# Patient Record
Sex: Male | Born: 1975
Health system: Southern US, Community
[De-identification: ages and names within clinical notes are randomized; demographics above are authoritative.]

## PROBLEM LIST (undated history)

## (undated) DIAGNOSIS — E119 Type 2 diabetes mellitus without complications: Secondary | ICD-10-CM

## (undated) DIAGNOSIS — K219 Gastro-esophageal reflux disease without esophagitis: Secondary | ICD-10-CM

## (undated) HISTORY — DX: Gastro-esophageal reflux disease without esophagitis: K21.9

## (undated) HISTORY — DX: Type 2 diabetes mellitus without complications: E11.9

---

## 2019-01-08 ENCOUNTER — Encounter (HOSPITAL_COMMUNITY): Payer: Self-pay | Admitting: Emergency Medicine

## 2019-01-08 ENCOUNTER — Emergency Department (HOSPITAL_COMMUNITY): Payer: 59

## 2019-01-08 ENCOUNTER — Observation Stay (HOSPITAL_COMMUNITY)
Admission: EM | Admit: 2019-01-08 | Discharge: 2019-01-11 | DRG: 392 | Disposition: A | Discharge status: Home / Self Care | Payer: 59 | Attending: Internal Medicine | Admitting: Internal Medicine

## 2019-01-08 ENCOUNTER — Other Ambulatory Visit: Payer: Self-pay

## 2019-01-08 DIAGNOSIS — F17210 Nicotine dependence, cigarettes, uncomplicated: Secondary | ICD-10-CM | POA: Diagnosis not present

## 2019-01-08 DIAGNOSIS — I10 Essential (primary) hypertension: Secondary | ICD-10-CM | POA: Diagnosis present

## 2019-01-08 DIAGNOSIS — R112 Nausea with vomiting, unspecified: Secondary | ICD-10-CM | POA: Diagnosis present

## 2019-01-08 DIAGNOSIS — F1010 Alcohol abuse, uncomplicated: Secondary | ICD-10-CM | POA: Diagnosis present

## 2019-01-08 DIAGNOSIS — R109 Unspecified abdominal pain: Secondary | ICD-10-CM

## 2019-01-08 DIAGNOSIS — K2900 Acute gastritis without bleeding: Secondary | ICD-10-CM | POA: Diagnosis not present

## 2019-01-08 DIAGNOSIS — K760 Fatty (change of) liver, not elsewhere classified: Secondary | ICD-10-CM | POA: Diagnosis not present

## 2019-01-08 DIAGNOSIS — Z720 Tobacco use: Secondary | ICD-10-CM | POA: Diagnosis present

## 2019-01-08 DIAGNOSIS — E119 Type 2 diabetes mellitus without complications: Secondary | ICD-10-CM | POA: Diagnosis not present

## 2019-01-08 DIAGNOSIS — R1084 Generalized abdominal pain: Secondary | ICD-10-CM

## 2019-01-08 DIAGNOSIS — Z1159 Encounter for screening for other viral diseases: Secondary | ICD-10-CM | POA: Diagnosis not present

## 2019-01-08 LAB — URINALYSIS, ROUTINE W REFLEX MICROSCOPIC
Bacteria, UA: NONE SEEN
Bilirubin Urine: NEGATIVE
Glucose, UA: >=500 mg/dL — AB
Hgb urine dipstick: NEGATIVE
Ketones, ur: 5 mg/dL — AB
Leukocytes,Ua: NEGATIVE
Nitrite: NEGATIVE
Protein, ur: NEGATIVE mg/dL
Specific Gravity, Urine: 1.013 (ref 1.005–1.030)
pH: 7 (ref 5.0–8.0)

## 2019-01-08 LAB — CBC WITH DIFFERENTIAL/PLATELET
Abs Immature Granulocytes: 0.01 10*3/uL (ref 0.00–0.07)
Basophils Absolute: 0 10*3/uL (ref 0.0–0.1)
Basophils Relative: 0 %
Eosinophils Absolute: 0 10*3/uL (ref 0.0–0.5)
Eosinophils Relative: 1 %
HCT: 43.7 % (ref 39.0–52.0)
Hemoglobin: 15.5 g/dL (ref 13.0–17.0)
Immature Granulocytes: 0 %
Lymphocytes Relative: 33 %
Lymphs Abs: 1.7 10*3/uL (ref 0.7–4.0)
MCH: 31.6 pg (ref 26.0–34.0)
MCHC: 35.5 g/dL (ref 30.0–36.0)
MCV: 89 fL (ref 80.0–100.0)
Monocytes Absolute: 0.3 10*3/uL (ref 0.1–1.0)
Monocytes Relative: 6 %
Neutro Abs: 3.2 10*3/uL (ref 1.7–7.7)
Neutrophils Relative %: 60 %
Platelets: 176 10*3/uL (ref 150–400)
RBC: 4.91 MIL/uL (ref 4.22–5.81)
RDW: 11.9 % (ref 11.5–15.5)
WBC: 5.2 10*3/uL (ref 4.0–10.5)
nRBC: 0 % (ref 0.0–0.2)

## 2019-01-08 LAB — COMPREHENSIVE METABOLIC PANEL
ALT: 67 U/L — ABNORMAL HIGH (ref 0–44)
AST: 54 U/L — ABNORMAL HIGH (ref 15–41)
Albumin: 3.8 g/dL (ref 3.5–5.0)
Alkaline Phosphatase: 65 U/L (ref 38–126)
Anion gap: 12 (ref 5–15)
BUN: 10 mg/dL (ref 6–20)
CO2: 22 mmol/L (ref 22–32)
Calcium: 8.8 mg/dL — ABNORMAL LOW (ref 8.9–10.3)
Chloride: 101 mmol/L (ref 98–111)
Creatinine, Ser: 0.8 mg/dL (ref 0.61–1.24)
GFR calc Af Amer: >60 mL/min (ref >60)
GFR calc non Af Amer: >60 mL/min (ref >60)
Glucose, Bld: 208 mg/dL — ABNORMAL HIGH (ref 70–99)
Potassium: 3.6 mmol/L (ref 3.5–5.1)
Sodium: 135 mmol/L (ref 135–145)
Total Bilirubin: 0.8 mg/dL (ref 0.3–1.2)
Total Protein: 6.6 g/dL (ref 6.5–8.1)

## 2019-01-08 LAB — LACTIC ACID, PLASMA
Lactic Acid, Venous: 1.8 mmol/L (ref 0.5–1.9)
Lactic Acid, Venous: 2 mmol/L (ref 0.5–1.9)
Lactic Acid, Venous: 1.4 mmol/L (ref 0.5–1.9)

## 2019-01-08 LAB — SARS CORONAVIRUS 2 BY RT PCR (HOSPITAL ORDER, PERFORMED IN ~~LOC~~ HOSPITAL LAB): SARS Coronavirus 2: NEGATIVE

## 2019-01-08 LAB — LIPASE, BLOOD: Lipase: 42 U/L (ref 11–51)

## 2019-01-08 MED ORDER — ONDANSETRON HCL 4 MG/2ML IJ SOLN
4.0000 mg | Freq: Once | INTRAMUSCULAR | Status: AC
  Administered 2019-01-08: 18:00:00 4 mg via INTRAVENOUS
  Filled 2019-01-08: qty 2

## 2019-01-08 MED ORDER — MORPHINE SULFATE (PF) 4 MG/ML IV SOLN
8.0000 mg | Freq: Once | INTRAVENOUS | Status: AC
  Administered 2019-01-08: 16:00:00 8 mg via INTRAVENOUS
  Filled 2019-01-08: qty 2

## 2019-01-08 MED ORDER — HYDROMORPHONE HCL 1 MG/ML IJ SOLN
1.0000 mg | Freq: Once | INTRAMUSCULAR | Status: AC
  Administered 2019-01-08: 21:00:00 1 mg via INTRAVENOUS
  Filled 2019-01-08: qty 1

## 2019-01-08 MED ORDER — LIDOCAINE VISCOUS HCL 2 % MT SOLN
15.0000 mL | Freq: Once | OROMUCOSAL | Status: AC
  Administered 2019-01-08: 18:00:00 15 mL via ORAL
  Filled 2019-01-08: qty 15

## 2019-01-08 MED ORDER — LACTATED RINGERS IV BOLUS
1000.0000 mL | Freq: Once | INTRAVENOUS | Status: AC
  Administered 2019-01-08: 16:00:00 1000 mL via INTRAVENOUS

## 2019-01-08 MED ORDER — HYDROMORPHONE HCL 1 MG/ML IJ SOLN
1.0000 mg | Freq: Once | INTRAMUSCULAR | Status: AC
  Administered 2019-01-09: 01:00:00 1 mg via INTRAVENOUS
  Filled 2019-01-08: qty 1

## 2019-01-08 MED ORDER — ONDANSETRON HCL 4 MG/2ML IJ SOLN
4.0000 mg | Freq: Once | INTRAMUSCULAR | Status: AC
  Administered 2019-01-08: 4 mg via INTRAVENOUS
  Filled 2019-01-08: qty 2

## 2019-01-08 MED ORDER — ALUM & MAG HYDROXIDE-SIMETH 200-200-20 MG/5ML PO SUSP
30.0000 mL | Freq: Once | ORAL | Status: AC
  Administered 2019-01-08: 18:00:00 30 mL via ORAL
  Filled 2019-01-08: qty 30

## 2019-01-08 MED ORDER — IOHEXOL 300 MG/ML  SOLN
100.0000 mL | Freq: Once | INTRAMUSCULAR | Status: AC | PRN
  Administered 2019-01-08: 100 mL via INTRAVENOUS

## 2019-01-08 MED ORDER — PANTOPRAZOLE SODIUM 40 MG IV SOLR
40.0000 mg | Freq: Once | INTRAVENOUS | Status: AC
  Administered 2019-01-08: 21:00:00 40 mg via INTRAVENOUS
  Filled 2019-01-08: qty 40

## 2019-01-08 NOTE — ED Provider Notes (Signed)
[facility]   CSN: [id number] Arrival date & time: 01/08/19  1425    History   Chief Complaint Chief Complaint  Patient presents with   Abdominal Pain    HPI Leroy Jimenez is a 43 y.o. male.  The history is provided by the patient. The history is limited by a language barrier. A language interpreter was used.   43 year old male with no significant past medical history presents with abdominal pain.  Patient states that he was diagnosed with stomach ulcers a few weeks ago.  He states he has had worsening abdominal pain over the past 15 days that is significantly worsened over the past 3 to 4 days.  He has had associated nonbloody emesis.  No melena or hematochezia.  Pain is located in the epigastrium and is nonradiating and described as sharp.  No prior abdominal surgeries.  History reviewed. No pertinent past medical history.  Patient Active Problem List   Diagnosis Date Noted   HTN (hypertension) 01/08/2019   Diabetes (HCC) 01/08/2019   Nausea and/or vomiting 01/08/2019    History reviewed. No pertinent surgical history.      Home Medications    Prior to Admission medications   Not on File    Family History History reviewed. No pertinent family history.  Social History Social History   Tobacco Use   Smoking status: Never Smoker   Smokeless tobacco: Never Used  Substance Use Topics   Alcohol use: Yes   Drug use: Not on file     Allergies   Patient has no known allergies.   Review of Systems Review of Systems  Constitutional: Negative for chills and fever.  HENT: Negative for ear pain and sore throat.   Eyes: Negative for pain and visual disturbance.  Respiratory: Negative for cough and shortness of breath.   Cardiovascular: Negative for chest pain and palpitations.  Gastrointestinal: Positive for abdominal pain, nausea and vomiting. Negative for blood in stool.  Genitourinary:  Negative for dysuria and hematuria.  Musculoskeletal: Negative for arthralgias and back pain.  Skin: Negative for color change and rash.  Neurological: Negative for seizures and syncope.  All other systems reviewed and are negative.    Physical Exam Updated Vital Signs BP 131/78    Pulse 65    Resp (!) 9    SpO2 98%   Physical Exam Vitals signs and nursing note reviewed.  Constitutional:      General: He is in acute distress.     Appearance: He is well-developed.  HENT:     Head: Normocephalic and atraumatic.  Eyes:     Conjunctiva/sclera: Conjunctivae normal.  Neck:     Musculoskeletal: Neck supple.  Cardiovascular:     Rate and Rhythm: Normal rate and regular rhythm.     Heart sounds: No murmur.  Pulmonary:     Effort: Pulmonary effort is normal. No respiratory distress.     Breath sounds: Normal breath sounds.  Abdominal:     Palpations: Abdomen is soft. There is no mass.     Tenderness: There is generalized abdominal tenderness and tenderness in the epigastric area. There is guarding. There is no right CVA tenderness or left CVA tenderness.  Musculoskeletal: Normal range of motion.  Skin:    General: Skin is warm and dry.  Neurological:     General: No focal deficit present.     Mental Status: He is alert and oriented to person, place, and time.  ED Treatments / Results  Labs (all labs ordered are listed, but only abnormal results are displayed) Labs Reviewed  URINALYSIS, ROUTINE W REFLEX MICROSCOPIC - Abnormal; Notable for the following components:      Result Value   Color, Urine STRAW (*)    Glucose, UA >=500 (*)    Ketones, ur 5 (*)    All other components within normal limits  LACTIC ACID, PLASMA - Abnormal; Notable for the following components:   Lactic Acid, Venous 2.0 (*)    All other components within normal limits  COMPREHENSIVE METABOLIC PANEL - Abnormal; Notable for the following components:   Glucose, Bld 208 (*)    Calcium 8.8 (*)    AST  54 (*)    ALT 67 (*)    All other components within normal limits  SARS CORONAVIRUS 2 (HOSPITAL ORDER, PERFORMED IN [facility])  LACTIC ACID, PLASMA  LACTIC ACID, PLASMA  CBC WITH DIFFERENTIAL/PLATELET  LIPASE, BLOOD  ETHANOL  RAPID URINE DRUG SCREEN, HOSP PERFORMED    EKG EKG Interpretation  Date/Time:  Tuesday Jan 08 2019 15:45:26 EDT Ventricular Rate:  82 PR Interval:    QRS Duration: 95 QT Interval:  396 QTC Calculation: 463 R Axis:   102 Text Interpretation:  Sinus rhythm Right axis deviation ECG OTHERWISE WITHIN NORMAL LIMITS No old tracing to compare Confirmed by Eber Hong ([id number]) on 01/08/2019 3:47:23 PM   Radiology Ct Abdomen Pelvis W Contrast  Result Date: 01/08/2019 CLINICAL DATA:  Generalized abdominal pain EXAM: CT ABDOMEN AND PELVIS WITH CONTRAST TECHNIQUE: Multidetector CT imaging of the abdomen and pelvis was performed using the standard protocol following bolus administration of intravenous contrast. CONTRAST:  OMNIPAQUE IOHEXOL 300 MG/ML  SOLN COMPARISON:  Chest x-ray 01/08/2019 FINDINGS: Lower chest: Lung bases demonstrate no acute consolidation or effusion. The heart size is normal. Small hiatal hernia. Hepatobiliary: Hepatic steatosis. No calcified gallstone or biliary dilatation Pancreas: Unremarkable. No pancreatic ductal dilatation or surrounding inflammatory changes. Spleen: Normal in size without focal abnormality. Adrenals/Urinary Tract: Adrenal glands are unremarkable. Kidneys are normal, without renal calculi, focal lesion, or hydronephrosis. Bladder is unremarkable. Stomach/Bowel: Stomach is within normal limits. Appendix appears normal. No evidence of bowel wall thickening, distention, or inflammatory changes. Vascular/Lymphatic: No significant vascular findings are present. No enlarged abdominal or pelvic lymph nodes. Reproductive: Prostate is unremarkable. Other: Small fat containing inguinal hernias. No free air or free fluid  Musculoskeletal: No acute or significant osseous findings. Degenerative changes at L5-S1. IMPRESSION: No CT evidence for acute intra-abdominal or pelvic abnormality. Electronically Signed   By: Jasmine Pang M.D.   On: 01/08/2019 19:02   Dg Chest Portable 1 View  Result Date: 01/08/2019 CLINICAL DATA:  Abdominal pain, diagnosed with ulcers EXAM: PORTABLE CHEST 1 VIEW COMPARISON:  None. FINDINGS: The heart size and mediastinal contours are within normal limits. Both lungs are clear. The visualized skeletal structures are unremarkable. IMPRESSION: No active disease. Electronically Signed   By: Elige Ko   On: 01/08/2019 16:11    Procedures Procedures (including critical care time)  Medications Ordered in ED Medications  HYDROmorphone (DILAUDID) injection 1 mg (has no administration in time range)  lactated ringers bolus 1,000 mL (0 mLs Intravenous Stopped 01/08/19 2049)  morphine 4 MG/ML injection 8 mg (8 mg Intravenous Given 01/08/19 1532)  ondansetron (ZOFRAN) injection 4 mg (4 mg Intravenous Given 01/08/19 1532)  alum & mag hydroxide-simeth (MAALOX/MYLANTA) 200-200-20 MG/5ML suspension 30 mL (30 mLs Oral Given 01/08/19 1759)  And  lidocaine (XYLOCAINE) 2 % viscous mouth solution 15 mL (15 mLs Oral Given 01/08/19 1759)  ondansetron (ZOFRAN) injection 4 mg (4 mg Intravenous Given 01/08/19 1755)  iohexol (OMNIPAQUE) 300 MG/ML solution 100 mL (100 mLs Intravenous Contrast Given 01/08/19 1843)  pantoprazole (PROTONIX) injection 40 mg (40 mg Intravenous Given 01/08/19 2050)  HYDROmorphone (DILAUDID) injection 1 mg (1 mg Intravenous Given 01/08/19 2050)     Initial Impression / Assessment and Plan / ED Course  I have reviewed the triage vital signs and the nursing notes.  Pertinent labs & imaging results that were available during my care of the patient were reviewed by me and considered in my medical decision making (see chart for details).  43 year old male with no significant past medical history  presents with abdominal pain.  Patient hemodynamically stable.  Afebrile.  On exam, patient appears very uncomfortable.  He has significant tenderness to palpation of the epigastrium associated guarding.  Differential includes gastric perforation, acute cholecystitis, nephrolithiasis, gastritis.  EKG: EKG is normal sinus rhythm.  Right axis deviation.  No ischemic changes.  IV established and 8 mg of IV morphine given.  Bolus of LR given.  On reevaluation, patient is still very uncomfortable.  1 g of Dilaudid given.  Sometime later he was still in pain and given a GI cocktail with minimal improvement in symptoms.   Upright chest x-ray shows no active disease.   Lipase 42.  Glucose 208, calcium 8.8, AST 54, and ALT 67.  Creatinine 0.8.Lactic acid 1.4 from 2 initially.  CBC unremarkable.  UA with 500 glucose, 5 ketones, no signs of infection.  Patient is still having ongoing abdominal pain, nausea, and vomiting.  Additional 1 mg of Dilaudid given as well as IV Protonix.  Patient admitted to the hospitalist for further management.  Final Clinical Impressions(s) / ED Diagnoses   Final diagnoses:  Generalized abdominal pain  Intractable vomiting with nausea, unspecified vomiting type    ED Discharge Orders    None       Vallery Ridge, MD 01/08/19 2349    Eber Hong, MD 01/14/19 [phone]

## 2019-01-08 NOTE — H&P (Signed)
History and Physical   Browning Southwood WUJ:[id number] DOB: 02/10/1976 DOA: 01/08/2019  Referring MD/NP/PA: Dr. Hyacinth Meeker  PCP: System, Pcp Not In   Outpatient Specialists: None  Patient coming from: Home  Chief Complaint: Abdominal pain with nausea and vomiting  HPI: Leroy Jimenez is a 43 y.o. male with medical history significant of Diabetes, hypertension and recent diagnosis of stomach ulcers apparently at [place] who came in with persistent nausea vomiting and epigastric abdominal pain.  He has been having abdominal pain over the last 15 days but has worsened in the last 3 to 4 days.  He was alcoholic his last alcohol intake was last Saturday.  He also smokes a few cigarettes a day.  Denied any other substance use.  Pain is currently 8 out of 10 in the epigastric region going to his back.  He is also been having significant nausea at the moment although vomiting seems to help slow down.  Patient is not acidotic.  He has no anion gap.  He has been taking his medications as prescribed.  He was treated in the ER but symptoms did not resolve.  Patient is therefore being admitted to the hospital for symptom management..  ED Course:  His temperature is 98 to blood pressure 200/111 pulse 85 respiratory rate of 16 oxygen sats 100% on room air.  Complete metabolic panels appears to be normal except for glucose of 208, AST 54 ALT 67.  Lactic acid 1.4.  Alcohol level is less than 10.  CT abdomen pelvis appears to be within normal.  Patient is being admitted with diagnosis of abdominal pain.  Review of Systems: As per HPI otherwise 10 point review of systems negative.    History reviewed. No pertinent past medical history.  History reviewed. No pertinent surgical history.   reports that he has never smoked. He has never used smokeless tobacco. He reports current alcohol use. No history on file for drug.  No Known Allergies  History reviewed. No pertinent family history.   Prior to  Admission medications   Not on File    Physical Exam: Vitals:   01/08/19 2130 01/08/19 2200 01/08/19 2230 01/09/19 0000  BP: 124/74 130/83 131/78 133/80  Pulse: 71 75 65 65  Resp: (!) 9 12 (!) 9 15  SpO2: 98% 99% 98% 99%      Constitutional: Acutely ill looking with moderate distress due to pain Vitals:   01/08/19 2130 01/08/19 2200 01/08/19 2230 01/09/19 0000  BP: 124/74 130/83 131/78 133/80  Pulse: 71 75 65 65  Resp: (!) 9 12 (!) 9 15  SpO2: 98% 99% 98% 99%   Eyes: PERRL, lids and conjunctivae normal ENMT: Mucous membranes are dry. Posterior pharynx clear of any exudate or lesions.Normal dentition.  Neck: normal, supple, no masses, no thyromegaly Respiratory: clear to auscultation bilaterally, no wheezing, no crackles. Normal respiratory effort. No accessory muscle use.  Cardiovascular: Regular rate and rhythm, no murmurs / rubs / gallops. No extremity edema. 2+ pedal pulses. No carotid bruits.  Abdomen: Mild epigastric tenderness, no masses palpated. No hepatosplenomegaly. Bowel sounds positive.  Musculoskeletal: no clubbing / cyanosis. No joint deformity upper and lower extremities. Good ROM, no contractures. Normal muscle tone.  Skin: no rashes, lesions, ulcers. No induration Neurologic: CN 2-12 grossly intact. Sensation intact, DTR normal. Strength 5/5 in all 4.  Psychiatric: Normal judgment and insight. Alert and oriented x 3. Normal mood.     Labs on Admission: I have personally reviewed following labs and imaging  studies  CBC: Recent Labs  Lab 01/08/19 1725  WBC 5.2  NEUTROABS 3.2  HGB 15.5  HCT 43.7  MCV 89.0  PLT 176   Basic Metabolic Panel: Recent Labs  Lab 01/08/19 1821  NA 135  K 3.6  CL 101  CO2 22  GLUCOSE 208*  BUN 10  CREATININE 0.80  CALCIUM 8.8*   GFR: CrCl cannot be calculated (Unknown ideal weight.). Liver Function Tests: Recent Labs  Lab 01/08/19 1821  AST 54*  ALT 67*  ALKPHOS 65  BILITOT 0.8  PROT 6.6  ALBUMIN 3.8    Recent Labs  Lab 01/08/19 1821  LIPASE 42   No results for input(s): AMMONIA in the last 168 hours. Coagulation Profile: No results for input(s): INR, PROTIME in the last 168 hours. Cardiac Enzymes: No results for input(s): CKTOTAL, CKMB, CKMBINDEX, TROPONINI in the last 168 hours. BNP (last 3 results) No results for input(s): PROBNP in the last 8760 hours. HbA1C: No results for input(s): HGBA1C in the last 72 hours. CBG: No results for input(s): GLUCAP in the last 168 hours. Lipid Profile: No results for input(s): CHOL, HDL, LDLCALC, TRIG, CHOLHDL, LDLDIRECT in the last 72 hours. Thyroid Function Tests: No results for input(s): TSH, T4TOTAL, FREET4, T3FREE, THYROIDAB in the last 72 hours. Anemia Panel: No results for input(s): VITAMINB12, FOLATE, FERRITIN, TIBC, IRON, RETICCTPCT in the last 72 hours. Urine analysis:    Component Value Date/Time   COLORURINE STRAW (A) 01/08/2019 1700   APPEARANCEUR CLEAR 01/08/2019 1700   LABSPEC 1.013 01/08/2019 1700   PHURINE 7.0 01/08/2019 1700   GLUCOSEU >=500 (A) 01/08/2019 1700   HGBUR NEGATIVE 01/08/2019 1700   BILIRUBINUR NEGATIVE 01/08/2019 1700   KETONESUR 5 (A) 01/08/2019 1700   PROTEINUR NEGATIVE 01/08/2019 1700   NITRITE NEGATIVE 01/08/2019 1700   LEUKOCYTESUR NEGATIVE 01/08/2019 1700   Sepsis Labs: @LABRCNTIP (procalcitonin:4,lacticidven:4) ) Recent Results (from the past 240 hour(s))  SARS Coronavirus 2 (CEPHEID - Performed in [facility] hospital lab), Hosp Order     Status: None   Collection Time: 01/08/19 10:24 PM  Result Value Ref Range Status   SARS Coronavirus 2 NEGATIVE NEGATIVE Final    Comment: (NOTE) If result is NEGATIVE SARS-CoV-2 target nucleic acids are NOT DETECTED. The SARS-CoV-2 RNA is generally detectable in upper and lower  respiratory specimens during the acute phase of infection. The lowest  concentration of SARS-CoV-2 viral copies this assay can detect is 250  copies / mL. A negative result  does not preclude SARS-CoV-2 infection  and should not be used as the sole basis for treatment or other  patient management decisions.  A negative result may occur with  improper specimen collection / handling, submission of specimen other  than nasopharyngeal swab, presence of viral mutation(s) within the  areas targeted by this assay, and inadequate number of viral copies  (<250 copies / mL). A negative result must be combined with clinical  observations, patient history, and epidemiological information. If result is POSITIVE SARS-CoV-2 target nucleic acids are DETECTED. The SARS-CoV-2 RNA is generally detectable in upper and lower  respiratory specimens dur ing the acute phase of infection.  Positive  results are indicative of active infection with SARS-CoV-2.  Clinical  correlation with patient history and other diagnostic information is  necessary to determine patient infection status.  Positive results do  not rule out bacterial infection or co-infection with other viruses. If result is PRESUMPTIVE POSTIVE SARS-CoV-2 nucleic acids MAY BE PRESENT.   A presumptive positive result  was obtained on the submitted specimen  and confirmed on repeat testing.  While 2019 novel coronavirus  (SARS-CoV-2) nucleic acids may be present in the submitted sample  additional confirmatory testing may be necessary for epidemiological  and / or clinical management purposes  to differentiate between  SARS-CoV-2 and other Sarbecovirus currently known to infect humans.  If clinically indicated additional testing with an alternate test  methodology [phone]([id number]) is advised. The SARS-CoV-2 RNA is generally  detectable in upper and lower respiratory sp ecimens during the acute  phase of infection. The expected result is Negative. Fact Sheet for Patients:  BoilerBrush.com.cyhttps://www.fda.gov/media/136312/download Fact Sheet for Healthcare Providers: https://pope.com/https://www.fda.gov/media/136313/download This test is not yet approved or  cleared by the [place] FDA and has been authorized for detection and/or diagnosis of SARS-CoV-2 by FDA under an Emergency Use Authorization (EUA).  This EUA will remain in effect (meaning this test can be used) for the duration of the COVID-19 declaration under Section 564(b)(1) of the Act, 21 U.S.C. section 360bbb-3(b)(1), unless the authorization is terminated or revoked sooner. Performed at [facility], [address] [place], [place] [address]      Radiological Exams on Admission: Ct Abdomen Pelvis W Contrast  Result Date: 01/08/2019 CLINICAL DATA:  Generalized abdominal pain EXAM: CT ABDOMEN AND PELVIS WITH CONTRAST TECHNIQUE: Multidetector CT imaging of the abdomen and pelvis was performed using the standard protocol following bolus administration of intravenous contrast. CONTRAST:  100mL OMNIPAQUE IOHEXOL 300 MG/ML  SOLN COMPARISON:  Chest x-ray 01/08/2019 FINDINGS: Lower chest: Lung bases demonstrate no acute consolidation or effusion. The heart size is normal. Small hiatal hernia. Hepatobiliary: Hepatic steatosis. No calcified gallstone or biliary dilatation Pancreas: Unremarkable. No pancreatic ductal dilatation or surrounding inflammatory changes. Spleen: Normal in size without focal abnormality. Adrenals/Urinary Tract: Adrenal glands are unremarkable. Kidneys are normal, without renal calculi, focal lesion, or hydronephrosis. Bladder is unremarkable. Stomach/Bowel: Stomach is within normal limits. Appendix appears normal. No evidence of bowel wall thickening, distention, or inflammatory changes. Vascular/Lymphatic: No significant vascular findings are present. No enlarged abdominal or pelvic lymph nodes. Reproductive: Prostate is unremarkable. Other: Small fat containing inguinal hernias. No free air or free fluid Musculoskeletal: No acute or significant osseous findings. Degenerative changes at L5-S1. IMPRESSION: No CT evidence for acute intra-abdominal or pelvic abnormality.  Electronically Signed   By: Jasmine PangKim  Fujinaga M.D.   On: 01/08/2019 19:02   Dg Chest Portable 1 View  Result Date: 01/08/2019 CLINICAL DATA:  Abdominal pain, diagnosed with ulcers EXAM: PORTABLE CHEST 1 VIEW COMPARISON:  None. FINDINGS: The heart size and mediastinal contours are within normal limits. Both lungs are clear. The visualized skeletal structures are unremarkable. IMPRESSION: No active disease. Electronically Signed   By: Elige KoHetal  Patel   On: 01/08/2019 16:11     Assessment/Plan Principal Problem:   Nausea and/or vomiting Active Problems:   HTN (hypertension)   Diabetes (HCC)   Tobacco abuse   Alcohol abuse     #1 intractable nausea vomiting abdominal pain: Patient may be having acute pancreatitis with his alcohol history.  It could also be related to his diabetes.  Gastroparesis is also likely.  I will check lipase.  His CT abdomen pelvis appears to be normal.  No evidence of gallstones or cholecystitis.  Symptomatic treatment therefore.  IV fluids and pain management.  Control nausea.  Patient reported recent diagnosis of peptic ulcer.  Initiate IV Protonix.  #2 diabetes: Initiate sliding scale insulin with home regimen.  #3 hypertension: Blood pressure control  will be initiated with home regimen.  Adjust as necessary.  #4 tobacco abuse: Cessation counseling with nicotine patch.  #5 history of alcohol abuse: Patient reported quitting alcohol now.  He has not had any alcohol in about 10 days.  Continue to encourage patient to stay quit.   DVT prophylaxis: Lovenox Code Status: Full code Family Communication: Discussed with the patient Disposition Plan: Home Consults called: None Admission status: Observation  Severity of Illness: The appropriate patient status for this patient is OBSERVATION. Observation status is judged to be reasonable and necessary in order to provide the required intensity of service to ensure the patient's safety. The patient's presenting symptoms,  physical exam findings, and initial radiographic and laboratory data in the context of their medical condition is felt to place them at decreased risk for further clinical deterioration. Furthermore, it is anticipated that the patient will be medically stable for discharge from the hospital within 2 midnights of admission. The following factors support the patient status of observation.   " The patient's presenting symptoms include abdominal pain nausea vomiting. " The physical exam findings include epigastric tenderness. " The initial radiographic and laboratory data are normal findings.     Lonia Blood MD [facility] Pager [phone]  If 7PM-7AM, please contact night-coverage [email or web address] Password [email or web address]  01/09/2019, 12:22 AM

## 2019-01-08 NOTE — ED Notes (Signed)
Pt continues to have severe pain, guarding and writhing back and forth in bed.  Orders for GI cocktail.

## 2019-01-08 NOTE — ED Notes (Signed)
Pt swabbed for COVID

## 2019-01-08 NOTE — ED Triage Notes (Signed)
Pt diagnosed with stomach ulcers in [place] a few weeks ago, given meds to help with pain. Pt having severe pain with no relief. Has been seen multiple times. Denies fever cough SOB. AO x 4 124/82, HR NSR 90. 16 resp. 97.1 temp, CBG 210.

## 2019-01-09 ENCOUNTER — Other Ambulatory Visit (HOSPITAL_COMMUNITY): Payer: 59

## 2019-01-09 ENCOUNTER — Inpatient Hospital Stay (HOSPITAL_COMMUNITY): Payer: 59

## 2019-01-09 DIAGNOSIS — F1010 Alcohol abuse, uncomplicated: Secondary | ICD-10-CM | POA: Diagnosis present

## 2019-01-09 DIAGNOSIS — I10 Essential (primary) hypertension: Secondary | ICD-10-CM | POA: Diagnosis not present

## 2019-01-09 DIAGNOSIS — R112 Nausea with vomiting, unspecified: Secondary | ICD-10-CM | POA: Diagnosis not present

## 2019-01-09 DIAGNOSIS — Z720 Tobacco use: Secondary | ICD-10-CM | POA: Diagnosis present

## 2019-01-09 DIAGNOSIS — R109 Unspecified abdominal pain: Secondary | ICD-10-CM

## 2019-01-09 LAB — RAPID URINE DRUG SCREEN, HOSP PERFORMED
Amphetamines: NOT DETECTED
Barbiturates: NOT DETECTED
Benzodiazepines: NOT DETECTED
Cocaine: NOT DETECTED
Opiates: POSITIVE — AB
Tetrahydrocannabinol: NOT DETECTED

## 2019-01-09 LAB — COMPREHENSIVE METABOLIC PANEL
ALT: 58 U/L — ABNORMAL HIGH (ref 0–44)
AST: 45 U/L — ABNORMAL HIGH (ref 15–41)
Albumin: 3.2 g/dL — ABNORMAL LOW (ref 3.5–5.0)
Alkaline Phosphatase: 49 U/L (ref 38–126)
Anion gap: 11 (ref 5–15)
BUN: 7 mg/dL (ref 6–20)
CO2: 24 mmol/L (ref 22–32)
Calcium: 8.2 mg/dL — ABNORMAL LOW (ref 8.9–10.3)
Chloride: 103 mmol/L (ref 98–111)
Creatinine, Ser: 0.75 mg/dL (ref 0.61–1.24)
GFR calc Af Amer: >60 mL/min (ref >60)
GFR calc non Af Amer: >60 mL/min (ref >60)
Glucose, Bld: 119 mg/dL — ABNORMAL HIGH (ref 70–99)
Potassium: 3.5 mmol/L (ref 3.5–5.1)
Sodium: 138 mmol/L (ref 135–145)
Total Bilirubin: 1.1 mg/dL (ref 0.3–1.2)
Total Protein: 5.8 g/dL — ABNORMAL LOW (ref 6.5–8.1)

## 2019-01-09 LAB — CBC
HCT: 40.3 % (ref 39.0–52.0)
Hemoglobin: 13.9 g/dL (ref 13.0–17.0)
MCH: 31.1 pg (ref 26.0–34.0)
MCHC: 34.5 g/dL (ref 30.0–36.0)
MCV: 90.2 fL (ref 80.0–100.0)
Platelets: 161 10*3/uL (ref 150–400)
RBC: 4.47 MIL/uL (ref 4.22–5.81)
RDW: 11.9 % (ref 11.5–15.5)
WBC: 5.1 10*3/uL (ref 4.0–10.5)
nRBC: 0 % (ref 0.0–0.2)

## 2019-01-09 LAB — GLUCOSE, CAPILLARY
Glucose-Capillary: 113 mg/dL — ABNORMAL HIGH (ref 70–99)
Glucose-Capillary: 120 mg/dL — ABNORMAL HIGH (ref 70–99)
Glucose-Capillary: 129 mg/dL — ABNORMAL HIGH (ref 70–99)
Glucose-Capillary: 143 mg/dL — ABNORMAL HIGH (ref 70–99)
Glucose-Capillary: 151 mg/dL — ABNORMAL HIGH (ref 70–99)

## 2019-01-09 LAB — HIV ANTIBODY (ROUTINE TESTING W REFLEX): HIV Screen 4th Generation wRfx: NONREACTIVE

## 2019-01-09 LAB — ETHANOL: Alcohol, Ethyl (B): <10 mg/dL (ref <10)

## 2019-01-09 MED ORDER — ALUM & MAG HYDROXIDE-SIMETH 200-200-20 MG/5ML PO SUSP
30.0000 mL | ORAL | Status: DC | PRN
  Administered 2019-01-10: 16:00:00 30 mL via ORAL
  Filled 2019-01-09: qty 30

## 2019-01-09 MED ORDER — INSULIN ASPART 100 UNIT/ML ~~LOC~~ SOLN: 0.0000 [IU] | Freq: Every day | SUBCUTANEOUS | Status: DC

## 2019-01-09 MED ORDER — PANTOPRAZOLE SODIUM 40 MG IV SOLR
40.0000 mg | Freq: Two times a day (BID) | INTRAVENOUS | Status: DC
  Administered 2019-01-09 – 2019-01-11 (×5): 40 mg via INTRAVENOUS
  Filled 2019-01-09 (×5): qty 40

## 2019-01-09 MED ORDER — HYDROMORPHONE HCL 1 MG/ML IJ SOLN
1.0000 mg | Freq: Once | INTRAMUSCULAR | Status: AC
  Administered 2019-01-09: 1 mg via INTRAVENOUS
  Filled 2019-01-09: qty 1

## 2019-01-09 MED ORDER — ONDANSETRON HCL 4 MG/2ML IJ SOLN
4.0000 mg | Freq: Four times a day (QID) | INTRAMUSCULAR | Status: DC | PRN
  Administered 2019-01-09 – 2019-01-10 (×6): 4 mg via INTRAVENOUS
  Filled 2019-01-09 (×7): qty 2

## 2019-01-09 MED ORDER — SODIUM CHLORIDE 0.9 % IV SOLN
INTRAVENOUS | Status: DC
  Administered 2019-01-09 – 2019-01-11 (×4): via INTRAVENOUS

## 2019-01-09 MED ORDER — ONDANSETRON HCL 4 MG PO TABS: 4.0000 mg | ORAL_TABLET | Freq: Four times a day (QID) | ORAL | Status: DC | PRN

## 2019-01-09 MED ORDER — NICOTINE 21 MG/24HR TD PT24
21.0000 mg | MEDICATED_PATCH | Freq: Every day | TRANSDERMAL | Status: DC
  Filled 2019-01-09 (×3): qty 1

## 2019-01-09 MED ORDER — TRAMADOL HCL 50 MG PO TABS
50.0000 mg | ORAL_TABLET | Freq: Four times a day (QID) | ORAL | Status: DC | PRN
  Administered 2019-01-09 – 2019-01-10 (×4): 50 mg via ORAL
  Filled 2019-01-09 (×4): qty 1

## 2019-01-09 MED ORDER — ENOXAPARIN SODIUM 40 MG/0.4ML ~~LOC~~ SOLN
40.0000 mg | SUBCUTANEOUS | Status: DC
  Administered 2019-01-09 – 2019-01-10 (×2): 40 mg via SUBCUTANEOUS
  Filled 2019-01-09 (×2): qty 0.4

## 2019-01-09 MED ORDER — INSULIN ASPART 100 UNIT/ML ~~LOC~~ SOLN
0.0000 [IU] | Freq: Three times a day (TID) | SUBCUTANEOUS | Status: DC
  Administered 2019-01-09: 09:00:00 2 [IU] via SUBCUTANEOUS
  Administered 2019-01-10: 1 [IU] via SUBCUTANEOUS
  Administered 2019-01-11: 09:00:00 2 [IU] via SUBCUTANEOUS

## 2019-01-09 MED ORDER — THIAMINE HCL 100 MG/ML IJ SOLN
Freq: Once | INTRAVENOUS | Status: AC
  Administered 2019-01-09: 01:00:00 via INTRAVENOUS
  Filled 2019-01-09: qty 1000

## 2019-01-09 MED ORDER — HYDROMORPHONE HCL 1 MG/ML IJ SOLN
0.5000 mg | INTRAMUSCULAR | Status: DC | PRN
  Administered 2019-01-09 – 2019-01-10 (×7): 0.5 mg via INTRAVENOUS
  Filled 2019-01-09 (×7): qty 0.5

## 2019-01-09 NOTE — ED Notes (Signed)
ED TO INPATIENT HANDOFF REPORT  ED Nurse Name and Phone #:  [phone]  S Name/Age/Gender Leroy Jimenez 43 y.o. male Room/Bed: 017C/017C  Code Status   Code Status: Not on file  Home/SNF/Other Home Patient oriented to: self, place, time and situation Is this baseline? Yes   Triage Complete: Triage complete  Chief Complaint abd pain  Triage Note Pt diagnosed with stomach ulcers in [place] a few weeks ago, given meds to help with pain. Pt having severe pain with no relief. Has been seen multiple times. Denies fever cough SOB. AO x 4 124/82, HR NSR 90. 16 resp. 97.1 temp, CBG 210.    Allergies No Known Allergies  Level of Care/Admitting Diagnosis ED Disposition    ED Disposition Condition Comment   Admit  Hospital Area: [facility] [[id number]]  Level of Care: Med-Surg [16]  I expect the patient will be discharged within 24 hours: Yes  LOW acuity---Tx typically complete <24 hrs---ACUTE conditions typically can be evaluated <24 hours---LABS likely to return to acceptable levels <24 hours---IS near functional baseline---EXPECTED to return to current living arrangement---NOT newly hypoxic: Meets criteria for 5C-Observation unit  Covid Evaluation: N/A  Diagnosis: Nausea and/or vomiting [201718]  Admitting Physician: Rometta Emery [[id number]]  Attending Physician: Rometta Emery [[id number]]  PT Class (Do Not Modify): Observation [104]  PT Acc Code (Do Not Modify): Observation [10022]       B Medical/Surgery History History reviewed. No pertinent past medical history. History reviewed. No pertinent surgical history.   A IV Location/Drains/Wounds Patient Lines/Drains/Airways Status   Active Line/Drains/Airways    Name:   Placement date:   Placement time:   Site:   Days:   Peripheral IV 01/08/19 Right Forearm   01/08/19    1500    Forearm   1          Intake/Output Last 24 hours  Intake/Output Summary (Last 24 hours) at 01/09/2019 0000 Last data  filed at 01/08/2019 2050 Gross per 24 hour  Intake 1000 ml  Output -  Net 1000 ml    Labs/Imaging Results for orders placed or performed during the hospital encounter of 01/08/19 (from the past 48 hour(s))  Lactic acid, plasma     Status: None   Collection Time: 01/08/19  3:14 PM  Result Value Ref Range   Lactic Acid, Venous 1.8 0.5 - 1.9 mmol/L    Comment: Performed at [facility], [address] [place], [place] [address]  Urinalysis, Routine w reflex microscopic     Status: Abnormal   Collection Time: 01/08/19  5:00 PM  Result Value Ref Range   Color, Urine STRAW (A) YELLOW   APPearance CLEAR CLEAR   Specific Gravity, Urine 1.013 1.005 - 1.030   pH 7.0 5.0 - 8.0   Glucose, UA >=500 (A) NEGATIVE mg/dL   Hgb urine dipstick NEGATIVE NEGATIVE   Bilirubin Urine NEGATIVE NEGATIVE   Ketones, ur 5 (A) NEGATIVE mg/dL   Protein, ur NEGATIVE NEGATIVE mg/dL   Nitrite NEGATIVE NEGATIVE   Leukocytes,Ua NEGATIVE NEGATIVE   RBC / HPF 0-5 0 - 5 RBC/hpf   WBC, UA 0-5 0 - 5 WBC/hpf   Bacteria, UA NONE SEEN NONE SEEN   Mucus PRESENT     Comment: Performed at [facility], [address] [place], [place] [address]  Lactic acid, plasma     Status: Abnormal   Collection Time: 01/08/19  5:24 PM  Result Value Ref Range  Lactic Acid, Venous 2.0 (HH) 0.5 - 1.9 mmol/L    Comment: CRITICAL RESULT CALLED TO, READ BACK BY AND VERIFIED WITHCherlyn Labella RN AT 1604 ON [id number] BY ATC Performed at [facility], [address] [place], [place] [address]   CBC with Differential/Platelet     Status: None   Collection Time: 01/08/19  5:25 PM  Result Value Ref Range   WBC 5.2 4.0 - 10.5 K/uL   RBC 4.91 4.22 - 5.81 MIL/uL   Hemoglobin 15.5 13.0 - 17.0 g/dL   HCT 11.9 14.7 - 82.9 %   MCV 89.0 80.0 - 100.0 fL   MCH 31.6 26.0 - 34.0 pg   MCHC 35.5 30.0 - 36.0 g/dL   RDW 56.2 13.0 - 86.5 %   Platelets 176 150 - 400 K/uL   nRBC 0.0 0.0 - 0.2 %   Neutrophils Relative % 60 %   Neutro  Abs 3.2 1.7 - 7.7 K/uL   Lymphocytes Relative 33 %   Lymphs Abs 1.7 0.7 - 4.0 K/uL   Monocytes Relative 6 %   Monocytes Absolute 0.3 0.1 - 1.0 K/uL   Eosinophils Relative 1 %   Eosinophils Absolute 0.0 0.0 - 0.5 K/uL   Basophils Relative 0 %   Basophils Absolute 0.0 0.0 - 0.1 K/uL   Immature Granulocytes 0 %   Abs Immature Granulocytes 0.01 0.00 - 0.07 K/uL    Comment: Performed at [facility], [address], [place], [place] [address]  Lactic acid, plasma     Status: None   Collection Time: 01/08/19  6:00 PM  Result Value Ref Range   Lactic Acid, Venous 1.4 0.5 - 1.9 mmol/L    Comment: Performed at [facility], [address] [place], [place] [address]  Comprehensive metabolic panel     Status: Abnormal   Collection Time: 01/08/19  6:21 PM  Result Value Ref Range   Sodium 135 135 - 145 mmol/L   Potassium 3.6 3.5 - 5.1 mmol/L   Chloride 101 98 - 111 mmol/L   CO2 22 22 - 32 mmol/L   Glucose, Bld 208 (H) 70 - 99 mg/dL   BUN 10 6 - 20 mg/dL   Creatinine, Ser 8.41 0.61 - 1.24 mg/dL   Calcium 8.8 (L) 8.9 - 10.3 mg/dL   Total Protein 6.6 6.5 - 8.1 g/dL   Albumin 3.8 3.5 - 5.0 g/dL   AST 54 (H) 15 - 41 U/L   ALT 67 (H) 0 - 44 U/L   Alkaline Phosphatase 65 38 - 126 U/L   Total Bilirubin 0.8 0.3 - 1.2 mg/dL   GFR calc non Af Amer >60 >60 mL/min   GFR calc Af Amer >60 >60 mL/min   Anion gap 12 5 - 15    Comment: Performed at [facility], [address] [place], [place] [address]  Lipase, blood     Status: None   Collection Time: 01/08/19  6:21 PM  Result Value Ref Range   Lipase 42 11 - 51 U/L    Comment: Performed at [facility], [address], [place], [place] [address]  SARS Coronavirus 2 (CEPHEID - Performed in [facility] hospital lab), Hosp Order     Status: None   Collection Time: 01/08/19 10:24 PM  Result Value Ref Range   SARS Coronavirus 2 NEGATIVE NEGATIVE    Comment: (NOTE) If result is NEGATIVE SARS-CoV-2 target nucleic acids are NOT  DETECTED.  The SARS-CoV-2 RNA is generally detectable in upper and lower  respiratory specimens during the acute phase of infection. The lowest  concentration of SARS-CoV-2 viral copies this assay can detect is 250  copies / mL. A negative result does not preclude SARS-CoV-2 infection  and should not be used as the sole basis for treatment or other  patient management decisions.  A negative result may occur with  improper specimen collection / handling, submission of specimen other  than nasopharyngeal swab, presence of viral mutation(s) within the  areas targeted by this assay, and inadequate number of viral copies  (<250 copies / mL). A negative result must be combined with clinical  observations, patient history, and epidemiological information. If result is POSITIVE SARS-CoV-2 target nucleic acids are DETECTED. The SARS-CoV-2 RNA is generally detectable in upper and lower  respiratory specimens dur ing the acute phase of infection.  Positive  results are indicative of active infection with SARS-CoV-2.  Clinical  correlation with patient history and other diagnostic information is  necessary to determine patient infection status.  Positive results do  not rule out bacterial infection or co-infection with other viruses. If result is PRESUMPTIVE POSTIVE SARS-CoV-2 nucleic acids MAY BE PRESENT.   A presumptive positive result was obtained on the submitted specimen  and confirmed on repeat testing.  While 2019 novel coronavirus  (SARS-CoV-2) nucleic acids may be present in the submitted sample  additional confirmatory testing may be necessary for epidemiological  and / or clinical management purposes  to differentiate between  SARS-CoV-2 and other Sarbecovirus currently known to infect humans.  If clinically indicated additional testing with an alternate test  methodology [phone]([id number]) is advised. The SARS-CoV-2 RNA is generally  detectable in upper and lower respiratory sp ecimens during  the acute  phase of infection. The expected result is Negative. Fact Sheet for Patients:  BoilerBrush.com.cyhttps://www.fda.gov/media/136312/download Fact Sheet for Healthcare Providers: https://pope.com/https://www.fda.gov/media/136313/download This test is not yet approved or cleared by the [place] FDA and has been authorized for detection and/or diagnosis of SARS-CoV-2 by FDA under an Emergency Use Authorization (EUA).  This EUA will remain in effect (meaning this test can be used) for the duration of the COVID-19 declaration under Section 564(b)(1) of the Act, 21 U.S.C. section 360bbb-3(b)(1), unless the authorization is terminated or revoked sooner. Performed at [facility], [address] [place], [place] [address]    Ct Abdomen Pelvis W Contrast  Result Date: 01/08/2019 CLINICAL DATA:  Generalized abdominal pain EXAM: CT ABDOMEN AND PELVIS WITH CONTRAST TECHNIQUE: Multidetector CT imaging of the abdomen and pelvis was performed using the standard protocol following bolus administration of intravenous contrast. CONTRAST:  100mL OMNIPAQUE IOHEXOL 300 MG/ML  SOLN COMPARISON:  Chest x-ray 01/08/2019 FINDINGS: Lower chest: Lung bases demonstrate no acute consolidation or effusion. The heart size is normal. Small hiatal hernia. Hepatobiliary: Hepatic steatosis. No calcified gallstone or biliary dilatation Pancreas: Unremarkable. No pancreatic ductal dilatation or surrounding inflammatory changes. Spleen: Normal in size without focal abnormality. Adrenals/Urinary Tract: Adrenal glands are unremarkable. Kidneys are normal, without renal calculi, focal lesion, or hydronephrosis. Bladder is unremarkable. Stomach/Bowel: Stomach is within normal limits. Appendix appears normal. No evidence of bowel wall thickening, distention, or inflammatory changes. Vascular/Lymphatic: No significant vascular findings are present. No enlarged abdominal or pelvic lymph nodes. Reproductive: Prostate is unremarkable. Other: Small fat  containing inguinal hernias. No free air or free fluid Musculoskeletal: No acute or significant osseous findings. Degenerative changes at L5-S1. IMPRESSION: No CT evidence for acute intra-abdominal  or pelvic abnormality. Electronically Signed   By: Jasmine Pang M.D.   On: 01/08/2019 19:02   Dg Chest Portable 1 View  Result Date: 01/08/2019 CLINICAL DATA:  Abdominal pain, diagnosed with ulcers EXAM: PORTABLE CHEST 1 VIEW COMPARISON:  None. FINDINGS: The heart size and mediastinal contours are within normal limits. Both lungs are clear. The visualized skeletal structures are unremarkable. IMPRESSION: No active disease. Electronically Signed   By: Elige Ko   On: 01/08/2019 16:11    Pending Labs Unresulted Labs (From admission, onward)    Start     Ordered   01/08/19 2326  Rapid urine drug screen (hospital performed)  Add-on,   R     01/08/19 2326   01/08/19 2318  Ethanol  ONCE - STAT,   STAT     01/08/19 2317   Signed and Held  HIV antibody (Routine Testing)  Once,   R     Signed and Held   Signed and Held  CBC  (enoxaparin (LOVENOX)    CrCl >/= 30 ml/min)  Once,   R    Comments:  Baseline for enoxaparin therapy IF NOT ALREADY DRAWN.  Notify MD if PLT < 100 K.    Signed and Held   Signed and Held  Creatinine, serum  (enoxaparin (LOVENOX)    CrCl >/= 30 ml/min)  Once,   R    Comments:  Baseline for enoxaparin therapy IF NOT ALREADY DRAWN.    Signed and Held   Signed and Held  Creatinine, serum  (enoxaparin (LOVENOX)    CrCl >/= 30 ml/min)  Weekly,   R    Comments:  while on enoxaparin therapy    Signed and Held   Signed and Held  Comprehensive metabolic panel  Tomorrow morning,   R     Signed and Held   Signed and Held  CBC  Tomorrow morning,   R     Signed and Held          Vitals/Pain Today's Vitals   01/08/19 2158 01/08/19 2200 01/08/19 2215 01/08/19 2230  BP:  130/83  131/78  Pulse:  75  65  Resp:  12  (!) 9  SpO2:  99%  98%  PainSc: 10-Worst pain ever  10-Worst pain  ever     Isolation Precautions No active isolations  Medications Medications  HYDROmorphone (DILAUDID) injection 1 mg (has no administration in time range)  lactated ringers bolus 1,000 mL (0 mLs Intravenous Stopped 01/08/19 2049)  morphine 4 MG/ML injection 8 mg (8 mg Intravenous Given 01/08/19 1532)  ondansetron (ZOFRAN) injection 4 mg (4 mg Intravenous Given 01/08/19 1532)  alum & mag hydroxide-simeth (MAALOX/MYLANTA) 200-200-20 MG/5ML suspension 30 mL (30 mLs Oral Given 01/08/19 1759)    And  lidocaine (XYLOCAINE) 2 % viscous mouth solution 15 mL (15 mLs Oral Given 01/08/19 1759)  ondansetron (ZOFRAN) injection 4 mg (4 mg Intravenous Given 01/08/19 1755)  iohexol (OMNIPAQUE) 300 MG/ML solution 100 mL (100 mLs Intravenous Contrast Given 01/08/19 1843)  pantoprazole (PROTONIX) injection 40 mg (40 mg Intravenous Given 01/08/19 2050)  HYDROmorphone (DILAUDID) injection 1 mg (1 mg Intravenous Given 01/08/19 2050)    Mobility walks Low fall risk   Focused Assessments Cardiac Assessment Handoff:  Cardiac Rhythm: Normal sinus rhythm No results found for: CKTOTAL, CKMB, CKMBINDEX, TROPONINI No results found for: DDIMER Does the Patient currently have chest pain? No      R Recommendations: See Admitting Provider Note  Report given to:  Additional Notes:  Spanish is patient first language. Understands some english.

## 2019-01-09 NOTE — Progress Notes (Signed)
Patient ID: Leroy Jimenez, male   DOB: [birth year], 43 y.o.   MRN: [id number]  PROGRESS NOTE    Leroy Jimenez  NWG:[id number] DOB: August 07, 1976 DOA: 01/08/2019 PCP: System, Pcp Not In   Brief Narrative:  43 year old male with history of alcohol abuse, diabetes, hypertension and recent diagnosis of stomach ulcers apparently at [facility] presented with nausea, vomiting and epigastric abdominal pain.  CT of the abdomen and pelvis was normal.  Assessment & Plan:   Principal Problem:   Nausea and/or vomiting Active Problems:   HTN (hypertension)   Diabetes (HCC)   Tobacco abuse   Alcohol abuse  Intractable nausea, vomiting and abdominal pain -Concern for acute gastritis/peptic ulcer disease -CT of abdomen and pelvis was negative for acute abnormality.  Lipase was normal. -We will add Protonix twice a day IV.  Also add Maalox. -Still in significant pain and requiring IV pain medications.  We will continue with Dilaudid for severe pain and tramadol for moderate pain.  Continue clear liquid diet for now. -Continue IV fluids as oral intake is poor  Diabetes mellitus type 2 -Continue Accu-Cheks with coverage.  Mildly elevated LFTs  -most likely secondary to alcohol abuse.  Will get right upper quadrant ultrasound.  Monitor LFTs.  Hypertension -Blood pressure stable for now.  Monitor   Alcohol abuse -Patient apparently quit alcohol and has not had any alcohol for the last 10 days prior to presentation -No signs of withdrawal currently  Tobacco abuse  -Patient was counseled regarding tobacco cessation.  Continue nicotine patch  DVT prophylaxis: Lovenox Code Status: Full Family Communication: None at bedside Disposition Plan: Home in 1 to 2 days once clinically improved  Consultants: None  Procedures: None  Antimicrobials: None   Subjective: Patient seen and examined at bedside.  Still complains of severe abdominal pain with nausea.  Not feeling better yet.  No  overnight fever.  Objective: Vitals:   01/09/19 0000 01/09/19 0027 01/09/19 0041 01/09/19 0538  BP: 133/80  129/78 127/78  Pulse: 65  62 (!) 58  Resp: 15  18   Temp:   98.5 F (36.9 C) 97.6 F (36.4 C)  TempSrc:   Oral Oral  SpO2: 99%  99% 98%  Weight:  71.6 kg    Height:   (1.626 m)      Intake/Output Summary (Last 24 hours) at 01/09/2019 1349 Last data filed at 01/09/2019 0830 Gross per 24 hour  Intake 1840 ml  Output -  Net 1840 ml   Filed Weights   01/09/19 0027  Weight: 71.6 kg    Examination:  General exam: Appears to be in mild distress secondary to abdominal pain. Respiratory system: Bilateral decreased breath sounds at bases Cardiovascular system: S1 & S2 heard, Rate controlled Gastrointestinal system: Abdomen is nondistended, soft and tender in the epigastric region; no rebound tenderness. Normal bowel sounds heard. Extremities: No cyanosis, clubbing, edema   Data Reviewed: I have personally reviewed following labs and imaging studies  CBC: Recent Labs  Lab 01/08/19 1725 01/09/19 0303  WBC 5.2 5.1  NEUTROABS 3.2  --   HGB 15.5 13.9  HCT 43.7 40.3  MCV 89.0 90.2  PLT 176 161   Basic Metabolic Panel: Recent Labs  Lab 01/08/19 1821 01/09/19 0303  NA 135 138  K 3.6 3.5  CL 101 103  CO2 22 24  GLUCOSE 208* 119*  BUN 10 7  CREATININE 0.80 0.75  CALCIUM 8.8* 8.2*   GFR: Estimated Creatinine Clearance: 109.2 mL/min (by  C-G formula based on SCr of 0.75 mg/dL). Liver Function Tests: Recent Labs  Lab 01/08/19 1821 01/09/19 0303  AST 54* 45*  ALT 67* 58*  ALKPHOS 65 49  BILITOT 0.8 1.1  PROT 6.6 5.8*  ALBUMIN 3.8 3.2*   Recent Labs  Lab 01/08/19 1821  LIPASE 42   No results for input(s): AMMONIA in the last 168 hours. Coagulation Profile: No results for input(s): INR, PROTIME in the last 168 hours. Cardiac Enzymes: No results for input(s): CKTOTAL, CKMB, CKMBINDEX, TROPONINI in the last 168 hours. BNP (last 3 results) No results  for input(s): PROBNP in the last 8760 hours. HbA1C: No results for input(s): HGBA1C in the last 72 hours. CBG: Recent Labs  Lab 01/09/19 0039 01/09/19 0804 01/09/19 1158  GLUCAP 120* 151* 113*   Lipid Profile: No results for input(s): CHOL, HDL, LDLCALC, TRIG, CHOLHDL, LDLDIRECT in the last 72 hours. Thyroid Function Tests: No results for input(s): TSH, T4TOTAL, FREET4, T3FREE, THYROIDAB in the last 72 hours. Anemia Panel: No results for input(s): VITAMINB12, FOLATE, FERRITIN, TIBC, IRON, RETICCTPCT in the last 72 hours. Sepsis Labs: Recent Labs  Lab 01/08/19 1514 01/08/19 1724 01/08/19 1800  LATICACIDVEN 1.8 2.0* 1.4    Recent Results (from the past 240 hour(s))  SARS Coronavirus 2 (CEPHEID - Performed in [facility] hospital lab), Hosp Order     Status: None   Collection Time: 01/08/19 10:24 PM  Result Value Ref Range Status   SARS Coronavirus 2 NEGATIVE NEGATIVE Final    Comment: (NOTE) If result is NEGATIVE SARS-CoV-2 target nucleic acids are NOT DETECTED. The SARS-CoV-2 RNA is generally detectable in upper and lower  respiratory specimens during the acute phase of infection. The lowest  concentration of SARS-CoV-2 viral copies this assay can detect is 250  copies / mL. A negative result does not preclude SARS-CoV-2 infection  and should not be used as the sole basis for treatment or other  patient management decisions.  A negative result may occur with  improper specimen collection / handling, submission of specimen other  than nasopharyngeal swab, presence of viral mutation(s) within the  areas targeted by this assay, and inadequate number of viral copies  (<250 copies / mL). A negative result must be combined with clinical  observations, patient history, and epidemiological information. If result is POSITIVE SARS-CoV-2 target nucleic acids are DETECTED. The SARS-CoV-2 RNA is generally detectable in upper and lower  respiratory specimens dur ing the acute phase  of infection.  Positive  results are indicative of active infection with SARS-CoV-2.  Clinical  correlation with patient history and other diagnostic information is  necessary to determine patient infection status.  Positive results do  not rule out bacterial infection or co-infection with other viruses. If result is PRESUMPTIVE POSTIVE SARS-CoV-2 nucleic acids MAY BE PRESENT.   A presumptive positive result was obtained on the submitted specimen  and confirmed on repeat testing.  While 2019 novel coronavirus  (SARS-CoV-2) nucleic acids may be present in the submitted sample  additional confirmatory testing may be necessary for epidemiological  and / or clinical management purposes  to differentiate between  SARS-CoV-2 and other Sarbecovirus currently known to infect humans.  If clinically indicated additional testing with an alternate test  methodology [phone]([id number]) is advised. The SARS-CoV-2 RNA is generally  detectable in upper and lower respiratory sp ecimens during the acute  phase of infection. The expected result is Negative. Fact Sheet for Patients:  BoilerBrush.com.cyhttps://www.fda.gov/media/136312/download Fact Sheet for Healthcare Providers: https://pope.com/https://www.fda.gov/media/136313/download This  test is not yet approved or cleared by the [place] and has been authorized for detection and/or diagnosis of SARS-CoV-2 by FDA under an Emergency Use Authorization (EUA).  This EUA will remain in effect (meaning this test can be used) for the duration of the COVID-19 declaration under Section 564(b)(1) of the Act, 21 U.S.C. section 360bbb-3(b)(1), unless the authorization is terminated or revoked sooner. Performed at [facility], [address], [place], [place] [address]          Radiology Studies: Ct Abdomen Pelvis W Contrast  Result Date: 01/08/2019 CLINICAL DATA:  Generalized abdominal pain EXAM: CT ABDOMEN AND PELVIS WITH CONTRAST TECHNIQUE: Multidetector CT imaging of the abdomen  and pelvis was performed using the standard protocol following bolus administration of intravenous contrast. CONTRAST:  OMNIPAQUE IOHEXOL 300 MG/ML  SOLN COMPARISON:  Chest x-ray 01/08/2019 FINDINGS: Lower chest: Lung bases demonstrate no acute consolidation or effusion. The heart size is normal. Small hiatal hernia. Hepatobiliary: Hepatic steatosis. No calcified gallstone or biliary dilatation Pancreas: Unremarkable. No pancreatic ductal dilatation or surrounding inflammatory changes. Spleen: Normal in size without focal abnormality. Adrenals/Urinary Tract: Adrenal glands are unremarkable. Kidneys are normal, without renal calculi, focal lesion, or hydronephrosis. Bladder is unremarkable. Stomach/Bowel: Stomach is within normal limits. Appendix appears normal. No evidence of bowel wall thickening, distention, or inflammatory changes. Vascular/Lymphatic: No significant vascular findings are present. No enlarged abdominal or pelvic lymph nodes. Reproductive: Prostate is unremarkable. Other: Small fat containing inguinal hernias. No free air or free fluid Musculoskeletal: No acute or significant osseous findings. Degenerative changes at L5-S1. IMPRESSION: No CT evidence for acute intra-abdominal or pelvic abnormality. Electronically Signed   By: Jasmine Pang M.D.   On: 01/08/2019 19:02   Dg Chest Portable 1 View  Result Date: 01/08/2019 CLINICAL DATA:  Abdominal pain, diagnosed with ulcers EXAM: PORTABLE CHEST 1 VIEW COMPARISON:  None. FINDINGS: The heart size and mediastinal contours are within normal limits. Both lungs are clear. The visualized skeletal structures are unremarkable. IMPRESSION: No active disease. Electronically Signed   By: Elige Ko   On: 01/08/2019 16:11        Scheduled Meds: . enoxaparin (LOVENOX) injection  40 mg Subcutaneous Q24H  . insulin aspart  0-5 Units Subcutaneous QHS  . insulin aspart  0-9 Units Subcutaneous TID WC  . nicotine  21 mg Transdermal Daily    Continuous Infusions: . sodium chloride 100 mL/hr at 01/09/19 0946     LOS: 0 days        Glade Lloyd, MD [facility] 01/09/2019, 1:49 PM

## 2019-01-09 NOTE — Progress Notes (Signed)
Patient admitted from ED. Patient is alert and oriented x4. Vital signs are stable. Skin assessment done with another nurse. Patient given medicine for pain and schedule as well. No any distress noted. Iv in place and and running fluid. Patient given instructions about call bell and phone. Bed in low position and call bell in reach. Patient's home medicine given to the pharmacy.

## 2019-01-09 NOTE — Plan of Care (Signed)

## 2019-01-09 NOTE — Progress Notes (Signed)
Per patient, his last drink was 15 days ago.  Patient states that he drinks 2 beers only per week.

## 2019-01-10 LAB — COMPREHENSIVE METABOLIC PANEL
ALT: 63 U/L — ABNORMAL HIGH (ref 0–44)
AST: 48 U/L — ABNORMAL HIGH (ref 15–41)
Albumin: 3.1 g/dL — ABNORMAL LOW (ref 3.5–5.0)
Alkaline Phosphatase: 39 U/L (ref 38–126)
Anion gap: 8 (ref 5–15)
BUN: <5 mg/dL — ABNORMAL LOW (ref 6–20)
CO2: 24 mmol/L (ref 22–32)
Calcium: 7.9 mg/dL — ABNORMAL LOW (ref 8.9–10.3)
Chloride: 104 mmol/L (ref 98–111)
Creatinine, Ser: 0.68 mg/dL (ref 0.61–1.24)
GFR calc Af Amer: >60 mL/min (ref >60)
GFR calc non Af Amer: >60 mL/min (ref >60)
Glucose, Bld: 171 mg/dL — ABNORMAL HIGH (ref 70–99)
Potassium: 3.3 mmol/L — ABNORMAL LOW (ref 3.5–5.1)
Sodium: 136 mmol/L (ref 135–145)
Total Bilirubin: 0.8 mg/dL (ref 0.3–1.2)
Total Protein: 5.8 g/dL — ABNORMAL LOW (ref 6.5–8.1)

## 2019-01-10 LAB — GLUCOSE, CAPILLARY
Glucose-Capillary: 143 mg/dL — ABNORMAL HIGH (ref 70–99)
Glucose-Capillary: 99 mg/dL (ref 70–99)
Glucose-Capillary: 85 mg/dL (ref 70–99)
Glucose-Capillary: 166 mg/dL — ABNORMAL HIGH (ref 70–99)

## 2019-01-10 LAB — MAGNESIUM: Magnesium: 1.8 mg/dL (ref 1.7–2.4)

## 2019-01-10 MED ORDER — CHLORHEXIDINE GLUCONATE 0.12 % MT SOLN
15.0000 mL | Freq: Two times a day (BID) | OROMUCOSAL | Status: DC
  Administered 2019-01-10 – 2019-01-11 (×2): 15 mL via OROMUCOSAL
  Filled 2019-01-10 (×2): qty 15

## 2019-01-10 MED ORDER — ALUM & MAG HYDROXIDE-SIMETH 200-200-20 MG/5ML PO SUSP: 30.0000 mL | ORAL | 0 refills | Status: DC | PRN

## 2019-01-10 MED ORDER — ONDANSETRON HCL 4 MG PO TABS: 4.0000 mg | ORAL_TABLET | Freq: Four times a day (QID) | ORAL | 0 refills | Status: DC | PRN

## 2019-01-10 MED ORDER — ORAL CARE MOUTH RINSE: 15.0000 mL | Freq: Two times a day (BID) | OROMUCOSAL | Status: DC

## 2019-01-10 MED ORDER — PANTOPRAZOLE SODIUM 40 MG PO TBEC: 40.0000 mg | DELAYED_RELEASE_TABLET | Freq: Two times a day (BID) | ORAL | 0 refills | Status: DC

## 2019-01-10 MED ORDER — OXYCODONE HCL 5 MG PO TABS: 5.0000 mg | ORAL_TABLET | Freq: Four times a day (QID) | ORAL | 0 refills | Status: DC | PRN

## 2019-01-10 MED ORDER — HYDROCORTISONE 1 % EX CREA
TOPICAL_CREAM | Freq: Three times a day (TID) | CUTANEOUS | Status: DC | PRN
  Administered 2019-01-11: 03:00:00 via TOPICAL
  Filled 2019-01-10: qty 28.35
  Filled 2019-01-10: qty 28

## 2019-01-10 MED ORDER — DIPHENHYDRAMINE HCL 25 MG PO CAPS
25.0000 mg | ORAL_CAPSULE | Freq: Once | ORAL | Status: AC
  Administered 2019-01-10: 20:00:00 25 mg via ORAL
  Filled 2019-01-10: qty 1

## 2019-01-10 MED ORDER — ZOLPIDEM TARTRATE 5 MG PO TABS
5.0000 mg | ORAL_TABLET | Freq: Every evening | ORAL | Status: DC | PRN
  Administered 2019-01-10 (×2): 5 mg via ORAL
  Filled 2019-01-10 (×2): qty 1

## 2019-01-10 MED FILL — ONDANSETRON HCL 4 MG TABLET: 4 | 5 days supply | Qty: 20 | Fill #0

## 2019-01-10 MED FILL — MINTOX MAXIMUM STRENGTH SUS: 400-400-40 | 3 days supply | Qty: 355 | Fill #0

## 2019-01-10 MED FILL — PANTOPRAZOLE SOD DR 40 MG T: 40 | 30 days supply | Qty: 60 | Fill #0

## 2019-01-10 MED FILL — oxyCODONE HCL 5 MG TABS: 5 | 5 days supply | Qty: 20 | Fill #0

## 2019-01-10 NOTE — Discharge Summary (Signed)
Physician Discharge Summary  Leroy Jimenez ZOX:[id number] DOB: 1975/12/06 DOA: 01/08/2019  PCP: System, Pcp Not In  Admit date: 01/08/2019 Discharge date: 01/10/2019  Admitted From: Home Disposition: Home  Recommendations for Outpatient Follow-up:  1. Follow up with PCP in 1 week with repeat CBC/CMP 2. Outpatient follow-up with gastroenterology 3. Abstain from alcohol 4. Follow up in ED if symptoms worsen or new appear   Home Health: No Equipment/Devices: None  Discharge Condition: Stable CODE STATUS: Full Diet recommendation: Regular  Brief/Interim Summary: 43 year old male with history of alcohol abuse, diabetes, hypertension and [facility] with nausea, vomiting and epigastric abdominal pain.  CT of the abdomen and pelvis was normal.  He was treated with IV fluids, analgesics and Protonix.  His condition is slowly improving.  Still has significant pain but tolerating liquid diet.  Will advance diet to soft diet.  If he tolerates diet, he will be discharged home.  Discharge Diagnoses:  Principal Problem:   Nausea and/or vomiting Active Problems:   HTN (hypertension)   Diabetes (HCC)   Tobacco abuse   Alcohol abuse  Intractable nausea, vomiting and abdominal pain -Concern for acute gastritis/peptic ulcer disease -CT of abdomen and pelvis was negative for acute abnormality.  Lipase was normal. -Treated with intravenous Protonix and oral Maalox. -Required intravenous Dilaudid.  Patient has been started on clear liquid diet.  He is still in some pain but wants to try soft diet.  If he tolerates soft diet today, he will be discharged home on oral Protonix and antacid.  He might need outpatient GI evaluation. -Treated with IV fluids as well.  Diabetes mellitus type 2 -Continue Accu-Cheks with coverage.  Outpatient follow-up.  Mildly elevated LFTs  -most likely secondary to alcohol abuse.    LFTs improving.  Right  upper quadrant ultrasound showed hepatic steatosis.  Hypertension -Blood pressure stable for now.    Outpatient follow-up.  Alcohol abuse -Patient apparently quit alcohol and has not had any alcohol for the last 10 days prior to presentation -No signs of withdrawal currently -Outpatient follow-up  Tobacco abuse  -Patient was counseled regarding tobacco cessation.    Outpatient follow-up.  Discharge Instructions  Discharge Instructions    Ambulatory referral to Gastroenterology   Complete by:  As directed    Abdominal pain   Call MD for:  persistant nausea and vomiting   Complete by:  As directed    Call MD for:  severe uncontrolled pain   Complete by:  As directed    Diet general   Complete by:  As directed    Increase activity slowly   Complete by:  As directed      Allergies as of 01/10/2019   No Known Allergies     Medication List    TAKE these medications   alum & mag hydroxide-simeth 200-200-20 MG/5ML suspension Commonly known as:  MAALOX/MYLANTA Take 30 mLs by mouth every 4 (four) hours as needed for indigestion or heartburn.   ondansetron 4 MG tablet Commonly known as:  ZOFRAN Take 1 tablet (4 mg total) by mouth every 6 (six) hours as needed for nausea.   oxyCODONE 5 MG immediate release tablet Commonly known as:  Oxy IR/ROXICODONE Take 1 tablet (5 mg total) by mouth every 6 (six) hours as needed for severe pain.   pantoprazole 40 MG tablet Commonly known as:  Protonix Take 1 tablet (40 mg total) by mouth 2 (two) times daily before a meal.  Follow-up Information    PCP. Schedule an appointment as soon as possible for a visit in 1 week(s).   Why:  In 1 week with repeat CBC/CMP         No Known Allergies  Consultations:  None   Procedures/Studies: Ct Abdomen Pelvis W Contrast  Result Date: 01/08/2019 CLINICAL DATA:  Generalized abdominal pain EXAM: CT ABDOMEN AND PELVIS WITH CONTRAST TECHNIQUE: Multidetector CT imaging of the abdomen and  pelvis was performed using the standard protocol following bolus administration of intravenous contrast. CONTRAST:  OMNIPAQUE IOHEXOL 300 MG/ML  SOLN COMPARISON:  Chest x-ray 01/08/2019 FINDINGS: Lower chest: Lung bases demonstrate no acute consolidation or effusion. The heart size is normal. Small hiatal hernia. Hepatobiliary: Hepatic steatosis. No calcified gallstone or biliary dilatation Pancreas: Unremarkable. No pancreatic ductal dilatation or surrounding inflammatory changes. Spleen: Normal in size without focal abnormality. Adrenals/Urinary Tract: Adrenal glands are unremarkable. Kidneys are normal, without renal calculi, focal lesion, or hydronephrosis. Bladder is unremarkable. Stomach/Bowel: Stomach is within normal limits. Appendix appears normal. No evidence of bowel wall thickening, distention, or inflammatory changes. Vascular/Lymphatic: No significant vascular findings are present. No enlarged abdominal or pelvic lymph nodes. Reproductive: Prostate is unremarkable. Other: Small fat containing inguinal hernias. No free air or free fluid Musculoskeletal: No acute or significant osseous findings. Degenerative changes at L5-S1. IMPRESSION: No CT evidence for acute intra-abdominal or pelvic abnormality. Electronically Signed   By: Jasmine Pang M.D.   On: 01/08/2019 19:02   Dg Chest Portable 1 View  Result Date: 01/08/2019 CLINICAL DATA:  Abdominal pain, diagnosed with ulcers EXAM: PORTABLE CHEST 1 VIEW COMPARISON:  None. FINDINGS: The heart size and mediastinal contours are within normal limits. Both lungs are clear. The visualized skeletal structures are unremarkable. IMPRESSION: No active disease. Electronically Signed   By: Elige Ko   On: 01/08/2019 16:11   US Abdomen Limited Ruq  Result Date: 01/09/2019 CLINICAL DATA:  Abdominal pain for 2 days EXAM: ULTRASOUND ABDOMEN LIMITED RIGHT UPPER QUADRANT COMPARISON:  CT from the previous day. FINDINGS: Gallbladder: No gallstones or wall  thickening visualized. No sonographic Murphy sign noted by sonographer. Common bile duct: Diameter: 2.2 mm Liver: Diffusely increased in echogenicity consistent with fatty infiltration. Portal vein is patent on color Doppler imaging with normal direction of blood flow towards the liver. IMPRESSION: Fatty liver. No acute abnormality noted. Electronically Signed   By: Alcide Clever M.D.   On: 01/09/2019 20:05       Subjective: Patient seen and examined at bedside.  He still complains of upper abdominal pain but slightly better.  He wants to try soft diet.  He thinks that he might be able to go home today if he tolerates soft diet.  No overnight fever or vomiting.  Discharge Exam: Vitals:   01/09/19 2216 01/10/19 0601  BP: 107/75 119/73  Pulse: (!) 58 62  Resp:  17  Temp:  98.5 F (36.9 C)  SpO2:  97%    General: Pt is alert, awake, not in acute distress Cardiovascular: rate controlled, S1/S2 + Respiratory: bilateral decreased breath sounds at bases Abdominal: Soft, epigastric tenderness present, no rebound tenderness, ND, bowel sounds + Extremities: no edema, no cyanosis    The results of significant diagnostics from this hospitalization (including imaging, microbiology, ancillary and laboratory) are listed below for reference.     Microbiology: Recent Results (from the past 240 hour(s))  SARS Coronavirus 2 (CEPHEID - Performed in [facility] hospital lab), [facility]  Status: None   Collection Time: 01/08/19 10:24 PM  Result Value Ref Range Status   SARS Coronavirus 2 NEGATIVE NEGATIVE Final    Comment: (NOTE) If result is NEGATIVE SARS-CoV-2 target nucleic acids are NOT DETECTED. The SARS-CoV-2 RNA is generally detectable in upper and lower  respiratory specimens during the acute phase of infection. The lowest  concentration of SARS-CoV-2 viral copies this assay can detect is 250  copies / mL. A negative result does not preclude SARS-CoV-2 infection  and should not be  used as the sole basis for treatment or other  patient management decisions.  A negative result may occur with  improper specimen collection / handling, submission of specimen other  than nasopharyngeal swab, presence of viral mutation(s) within the  areas targeted by this assay, and inadequate number of viral copies  (<250 copies / mL). A negative result must be combined with clinical  observations, patient history, and epidemiological information. If result is POSITIVE SARS-CoV-2 target nucleic acids are DETECTED. The SARS-CoV-2 RNA is generally detectable in upper and lower  respiratory specimens dur ing the acute phase of infection.  Positive  results are indicative of active infection with SARS-CoV-2.  Clinical  correlation with patient history and other diagnostic information is  necessary to determine patient infection status.  Positive results do  not rule out bacterial infection or co-infection with other viruses. If result is PRESUMPTIVE POSTIVE SARS-CoV-2 nucleic acids MAY BE PRESENT.   A presumptive positive result was obtained on the submitted specimen  and confirmed on repeat testing.  While 2019 novel coronavirus  (SARS-CoV-2) nucleic acids may be present in the submitted sample  additional confirmatory testing may be necessary for epidemiological  and / or clinical management purposes  to differentiate between  SARS-CoV-2 and other Sarbecovirus currently known to infect humans.  If clinically indicated additional testing with an alternate test  methodology [phone]) is advised. The SARS-CoV-2 RNA is generally  detectable in upper and lower respiratory sp ecimens during the acute  phase of infection. The expected result is Negative. Fact Sheet for Patients:  BoilerBrush.com.cy Fact Sheet for Healthcare Providers: https://pope.com/ This test is not yet approved or cleared by the [place] FDA and has been authorized  for detection and/or diagnosis of SARS-CoV-2 by FDA under an Emergency Use Authorization (EUA).  This EUA will remain in effect (meaning this test can be used) for the duration of the COVID-19 declaration under Section 564(b)(1) of the Act, 21 U.S.C. section 360bbb-3(b)(1), unless the authorization is terminated or revoked sooner. Performed at [facility], [address], [place], [place] [address]      Labs: BNP (last 3 results) No results for input(s): BNP in the last 8760 hours. Basic Metabolic Panel: Recent Labs  Lab 01/08/19 1821 01/09/19 0303 01/10/19 0420  NA 135 138 136  K 3.6 3.5 3.3*  CL 101 103 104  CO2 GLUCOSE 208* 119* 171*  BUN 10 7 <5*  CREATININE 0.80 0.75 0.68  CALCIUM 8.8* 8.2* 7.9*  MG  --   --  1.8   Liver Function Tests: Recent Labs  Lab 01/08/19 1821 01/09/19 0303 01/10/19 0420  AST 54* 45* 48*  ALT 67* 58* 63*  ALKPHOS 65 49 39  BILITOT 0.8 1.1 0.8  PROT 6.6 5.8* 5.8*  ALBUMIN 3.8 3.2* 3.1*   Recent Labs  Lab 01/08/19 1821  LIPASE 42   No results for input(s): AMMONIA in the last 168 hours. CBC: Recent  Labs  Lab 01/08/19 1725 01/09/19 0303  WBC 5.2 5.1  NEUTROABS 3.2  --   HGB 15.5 13.9  HCT 43.7 40.3  MCV 89.0 90.2  PLT 176 161   Cardiac Enzymes: No results for input(s): CKTOTAL, CKMB, CKMBINDEX, TROPONINI in the last 168 hours. BNP: Invalid input(s): POCBNP CBG: Recent Labs  Lab 01/09/19 0804 01/09/19 1158 01/09/19 1714 01/09/19 2157 01/10/19 0753  GLUCAP 151* 113* 129* 143* 143*   D-Dimer No results for input(s): DDIMER in the last 72 hours. Hgb A1c No results for input(s): HGBA1C in the last 72 hours. Lipid Profile No results for input(s): CHOL, HDL, LDLCALC, TRIG, CHOLHDL, LDLDIRECT in the last 72 hours. Thyroid function studies No results for input(s): TSH, T4TOTAL, T3FREE, THYROIDAB in the last 72 hours.  Invalid input(s): FREET3 Anemia work up No results for input(s): VITAMINB12,  FOLATE, FERRITIN, TIBC, IRON, RETICCTPCT in the last 72 hours. Urinalysis    Component Value Date/Time   COLORURINE STRAW (A) 01/08/2019 1700   APPEARANCEUR CLEAR 01/08/2019 1700   LABSPEC 1.013 01/08/2019 1700   PHURINE 7.0 01/08/2019 1700   GLUCOSEU >=500 (A) 01/08/2019 1700   HGBUR NEGATIVE 01/08/2019 1700   BILIRUBINUR NEGATIVE 01/08/2019 1700   KETONESUR 5 (A) 01/08/2019 1700   PROTEINUR NEGATIVE 01/08/2019 1700   NITRITE NEGATIVE 01/08/2019 1700   LEUKOCYTESUR NEGATIVE 01/08/2019 1700   Sepsis Labs Invalid input(s): PROCALCITONIN,  WBC,  LACTICIDVEN Microbiology Recent Results (from the past 240 hour(s))  SARS Coronavirus 2 (CEPHEID - Performed in [facility] hospital lab), Hosp Order     Status: None   Collection Time: 01/08/19 10:24 PM  Result Value Ref Range Status   SARS Coronavirus 2 NEGATIVE NEGATIVE Final    Comment: (NOTE) If result is NEGATIVE SARS-CoV-2 target nucleic acids are NOT DETECTED. The SARS-CoV-2 RNA is generally detectable in upper and lower  respiratory specimens during the acute phase of infection. The lowest  concentration of SARS-CoV-2 viral copies this assay can detect is 250  copies / mL. A negative result does not preclude SARS-CoV-2 infection  and should not be used as the sole basis for treatment or other  patient management decisions.  A negative result may occur with  improper specimen collection / handling, submission of specimen other  than nasopharyngeal swab, presence of viral mutation(s) within the  areas targeted by this assay, and inadequate number of viral copies  (<250 copies / mL). A negative result must be combined with clinical  observations, patient history, and epidemiological information. If result is POSITIVE SARS-CoV-2 target nucleic acids are DETECTED. The SARS-CoV-2 RNA is generally detectable in upper and lower  respiratory specimens dur ing the acute phase of infection.  Positive  results are indicative of active  infection with SARS-CoV-2.  Clinical  correlation with patient history and other diagnostic information is  necessary to determine patient infection status.  Positive results do  not rule out bacterial infection or co-infection with other viruses. If result is PRESUMPTIVE POSTIVE SARS-CoV-2 nucleic acids MAY BE PRESENT.   A presumptive positive result was obtained on the submitted specimen  and confirmed on repeat testing.  While 2019 novel coronavirus  (SARS-CoV-2) nucleic acids may be present in the submitted sample  additional confirmatory testing may be necessary for epidemiological  and / or clinical management purposes  to differentiate between  SARS-CoV-2 and other Sarbecovirus currently known to infect humans.  If clinically indicated additional testing with an alternate test  methodology [phone]) is advised. The SARS-CoV-2 RNA  is generally  detectable in upper and lower respiratory sp ecimens during the acute  phase of infection. The expected result is Negative. Fact Sheet for Patients:  BoilerBrush.com.cy Fact Sheet for Healthcare Providers: https://pope.com/ This test is not yet approved or cleared by the [place] FDA and has been authorized for detection and/or diagnosis of SARS-CoV-2 by FDA under an Emergency Use Authorization (EUA).  This EUA will remain in effect (meaning this test can be used) for the duration of the COVID-19 declaration under Section 564(b)(1) of the Act, 21 U.S.C. section 360bbb-3(b)(1), unless the authorization is terminated or revoked sooner. Performed at [facility], [address], [place], [place] [address]      Time coordinating discharge: 35 minutes  SIGNED:   Glade Lloyd, MD  [facility] 01/10/2019, 11:24 AM

## 2019-01-10 NOTE — TOC Initial Note (Signed)
Transition of Care [facility]) - Initial/Assessment Note    Patient Details  Name: Leroy Jimenez MRN: [id number] Date of Birth: [birth year]  [facility] CM/SW Contact:    Epifanio Lesches, RN Phone Number: 01/10/2019, 12:33 PM  Clinical Narrative:    Presented with abd pain , N/V. From home with family. Primary language is Spanish, speaks some [place]. Independent with ADL's, no DME usage.  PCP: Olen Pel PA . Pt without medication issue/needs. States has transportation to home when medically ready.  Hospital f/u with PCP scheduled and noted on AVS. TOC pharmacy to deliver Rx meds to bedside prior to d/c.  Kevan Rosebush [facility])     [phone]       Expected Discharge Plan: Home/Self Care Barriers to Discharge: Continued Medical Work up   Patient Goals and CMS Choice        Expected Discharge Plan and Services Expected Discharge Plan: Home/Self Care In-house Referral: NA Discharge Planning Services: Follow-up appt scheduled Post Acute Care Choice: NA Living arrangements for the past 2 months: Apartment Expected Discharge Date: 01/10/19                         HH Arranged: NA HH Agency: NA        Prior Living Arrangements/Services Living arrangements for the past 2 months: Apartment   Patient language and need for interpreter reviewed:: Yes(Pt primary language is Spanish, speals some English)        Need for Family Participation in Patient Care: No (Comment) Care giver support system in place?: No (comment)   Criminal Activity/Legal Involvement Pertinent to Current Situation/Hospitalization: No - Comment as needed  Activities of Daily Living Home Assistive Devices/Equipment: None ADL Screening (condition at time of admission) Patient's cognitive ability adequate to safely complete daily activities?: Yes Is the patient deaf or have difficulty hearing?: No Does the patient have difficulty seeing, even when wearing glasses/contacts?:  No Does the patient have difficulty concentrating, remembering, or making decisions?: No Patient able to express need for assistance with ADLs?: Yes Does the patient have difficulty dressing or bathing?: No Independently performs ADLs?: Yes (appropriate for developmental age) Does the patient have difficulty walking or climbing stairs?: No Weakness of Legs: None Weakness of Arms/Hands: None  Permission Sought/Granted Permission sought to share information with : Case Manager, Family Supports Permission granted to share information with : Yes, Release of Information Signed  Share Information with NAME: Kevan Rosebush (Sgo)           Emotional Assessment Appearance:: Appears stated age Attitude/Demeanor/Rapport: Engaged, Gracious Affect (typically observed): Accepting Orientation: : Oriented to Self, Oriented to  Time, Oriented to Place, Oriented to Situation   Psych Involvement: No (comment)  Admission diagnosis:  Generalized abdominal pain [R10.84] Intractable vomiting with nausea, unspecified vomiting type [R11.2] Patient Active Problem List   Diagnosis Date Noted  . Tobacco abuse 01/09/2019  . Alcohol abuse 01/09/2019  . HTN (hypertension) 01/08/2019  . Diabetes (HCC) 01/08/2019  . Nausea and/or vomiting 01/08/2019   PCP:  Kurtis Bushman, PA Pharmacy:   [facility] [phone] - [place], [place] - [address] AT [facility] [address] [place]  [address] Phone: [phone] Fax: [phone]  [facility] - [place], [place] - [address] [place] [address] Phone: [phone] Fax: [phone]     Social Determinants of Health (SDOH) Interventions  Readmission Risk Interventions No flowsheet data found.

## 2019-01-10 NOTE — Progress Notes (Signed)
Patient requesting for sleep medication.Text paged Schorr, Roma Kayser, NP.

## 2019-01-11 DIAGNOSIS — F1010 Alcohol abuse, uncomplicated: Secondary | ICD-10-CM | POA: Diagnosis not present

## 2019-01-11 DIAGNOSIS — I10 Essential (primary) hypertension: Secondary | ICD-10-CM | POA: Diagnosis not present

## 2019-01-11 DIAGNOSIS — R109 Unspecified abdominal pain: Secondary | ICD-10-CM | POA: Diagnosis not present

## 2019-01-11 DIAGNOSIS — R112 Nausea with vomiting, unspecified: Secondary | ICD-10-CM | POA: Diagnosis not present

## 2019-01-11 LAB — COMPREHENSIVE METABOLIC PANEL
ALT: 64 U/L — ABNORMAL HIGH (ref 0–44)
AST: 53 U/L — ABNORMAL HIGH (ref 15–41)
Albumin: 3.3 g/dL — ABNORMAL LOW (ref 3.5–5.0)
Alkaline Phosphatase: 48 U/L (ref 38–126)
Anion gap: 11 (ref 5–15)
BUN: <5 mg/dL — ABNORMAL LOW (ref 6–20)
CO2: 27 mmol/L (ref 22–32)
Calcium: 8.3 mg/dL — ABNORMAL LOW (ref 8.9–10.3)
Chloride: 100 mmol/L (ref 98–111)
Creatinine, Ser: 0.61 mg/dL (ref 0.61–1.24)
GFR calc Af Amer: >60 mL/min (ref >60)
GFR calc non Af Amer: >60 mL/min (ref >60)
Glucose, Bld: 123 mg/dL — ABNORMAL HIGH (ref 70–99)
Potassium: 3.2 mmol/L — ABNORMAL LOW (ref 3.5–5.1)
Sodium: 138 mmol/L (ref 135–145)
Total Bilirubin: 0.8 mg/dL (ref 0.3–1.2)
Total Protein: 5.9 g/dL — ABNORMAL LOW (ref 6.5–8.1)

## 2019-01-11 LAB — HEPATITIS PANEL, ACUTE
HCV Ab: <0.1 s/co ratio (ref 0.0–0.9)
Hep A IgM: NEGATIVE
Hep B C IgM: NEGATIVE
Hepatitis B Surface Ag: NEGATIVE

## 2019-01-11 LAB — MAGNESIUM: Magnesium: 2.1 mg/dL (ref 1.7–2.4)

## 2019-01-11 LAB — GLUCOSE, CAPILLARY: Glucose-Capillary: 163 mg/dL — ABNORMAL HIGH (ref 70–99)

## 2019-01-11 MED ORDER — POTASSIUM CHLORIDE CRYS ER 20 MEQ PO TBCR
40.0000 meq | EXTENDED_RELEASE_TABLET | Freq: Once | ORAL | Status: AC
  Administered 2019-01-11: 40 meq via ORAL
  Filled 2019-01-11: qty 2

## 2019-01-11 NOTE — Progress Notes (Signed)
Se habl con el paciente/cuidador sobre las instrucciones para el alta (incluyendo medicamentos) y se le proporcion una copia 

## 2019-01-11 NOTE — Discharge Summary (Signed)
Physician Discharge Summary  Leroy HawthorneLeonardo Jimenez Leroy Jimenez ZOX:[id number] DOB: 1976/05/24 DOA: 01/08/2019  PCP: Leroy BushmanGonzalez, Rodalyn, PA  Admit date: 01/08/2019 Discharge date: 01/11/2019  Admitted From: Home Disposition: Home  Recommendations for Outpatient Follow-up:  1. Follow up with PCP in 1 week with repeat CBC/CMP 2. Outpatient follow-up with gastroenterology 3. Abstain from alcohol 4. Follow up in ED if symptoms worsen or new appear   Home Health: No Equipment/Devices: None  Discharge Condition: Stable CODE STATUS: Full Diet recommendation: Regular  Brief/Interim Summary: 43 year old male with history of alcohol abuse, diabetes, hypertension and [facility] with nausea, vomiting and epigastric abdominal pain.  CT of the abdomen and pelvis was normal.  He was treated with IV fluids, analgesics and Protonix.  His condition is slowly improving.  Still has significant pain but tolerating liquid diet.  He was supposed to be discharged on 01/10/2019 but could not tolerate diet so discharge was held. If he tolerates soft diet today, he will be discharged home. Discharge Diagnoses:  Principal Problem:   Nausea and/or vomiting Active Problems:   HTN (hypertension)   Diabetes (HCC)   Tobacco abuse   Alcohol abuse  Intractable nausea, vomiting and abdominal pain -Concern for acute gastritis/peptic ulcer disease -CT of abdomen and pelvis was negative for acute abnormality.  Lipase was normal. -Treated with intravenous Protonix and oral Maalox. -Required intravenous Dilaudid.  Patient has been started on clear liquid diet.  He is still in some pain but wants to try soft diet. He was supposed to be discharged on 01/10/2019 but could not tolerate diet so discharge was held. - If he tolerates soft diet today, he will be discharged home on oral Protonix and antacid.  He might need outpatient GI evaluation. -Treated with IV fluids as well.  Diabetes  mellitus type 2 -Continue Accu-Cheks with coverage.  Outpatient follow-up.  Mildly elevated LFTs  -most likely secondary to alcohol abuse.    LFTs improving.  Right upper quadrant ultrasound showed hepatic steatosis.  Hypertension -Blood pressure stable for now.    Outpatient follow-up.  Alcohol abuse -Patient apparently quit alcohol and has not had any alcohol for the last 10 days prior to presentation -No signs of withdrawal currently -Outpatient follow-up  Tobacco abuse  -Patient was counseled regarding tobacco cessation.    Outpatient follow-up.  Discharge Instructions  Discharge Instructions    Ambulatory referral to Gastroenterology   Complete by:  As directed    Abdominal pain   Call MD for:  persistant nausea and vomiting   Complete by:  As directed    Call MD for:  severe uncontrolled pain   Complete by:  As directed    Diet general   Complete by:  As directed    Increase activity slowly   Complete by:  As directed      Allergies as of 01/11/2019   No Known Allergies     Medication List    TAKE these medications   alum & mag hydroxide-simeth 200-200-20 MG/5ML suspension Commonly known as:  MAALOX/MYLANTA Take 30 mLs by mouth every 4 (four) hours as needed for indigestion or heartburn.   ondansetron 4 MG tablet Commonly known as:  ZOFRAN Take 1 tablet (4 mg total) by mouth every 6 (six) hours as needed for nausea.   oxyCODONE 5 MG immediate release tablet Commonly known as:  Oxy IR/ROXICODONE Take 1 tablet (5 mg total) by mouth every 6 (six) hours as needed for severe pain.  pantoprazole 40 MG tablet Commonly known as:  Protonix Take 1 tablet (40 mg total) by mouth 2 (two) times daily before a meal.      Follow-up Information    Leroy Jimenez / Leroy Bushman PA. Go on 01/19/2019.   Why:   PCP follow up scheduled for 01/19/2019 at 9:40 am. Pt will need repeat CBC/CMP labwork drawn  Contact information:  Leroy Consultorios  Jimenez  [phone]         No Known Allergies  Consultations:  None   Procedures/Studies: Ct Abdomen Pelvis W Contrast  Result Date: 01/08/2019 CLINICAL DATA:  Generalized abdominal pain EXAM: CT ABDOMEN AND PELVIS WITH CONTRAST TECHNIQUE: Multidetector CT imaging of the abdomen and pelvis was performed using the standard protocol following bolus administration of intravenous contrast. CONTRAST:  OMNIPAQUE IOHEXOL 300 MG/ML  SOLN COMPARISON:  Chest x-ray 01/08/2019 FINDINGS: Lower chest: Lung bases demonstrate no acute consolidation or effusion. The heart size is normal. Small hiatal hernia. Hepatobiliary: Hepatic steatosis. No calcified gallstone or biliary dilatation Pancreas: Unremarkable. No pancreatic ductal dilatation or surrounding inflammatory changes. Spleen: Normal in size without focal abnormality. Adrenals/Urinary Tract: Adrenal glands are unremarkable. Kidneys are normal, without renal calculi, focal lesion, or hydronephrosis. Bladder is unremarkable. Stomach/Bowel: Stomach is within normal limits. Appendix appears normal. No evidence of bowel wall thickening, distention, or inflammatory changes. Vascular/Lymphatic: No significant vascular findings are present. No enlarged abdominal or pelvic lymph nodes. Reproductive: Prostate is unremarkable. Other: Small fat containing inguinal hernias. No free air or free fluid Musculoskeletal: No acute or significant osseous findings. Degenerative changes at L5-S1. IMPRESSION: No CT evidence for acute intra-abdominal or pelvic abnormality. Electronically Signed   By: Jasmine Pang M.D.   On: 01/08/2019 19:02   Dg Chest Portable 1 View  Result Date: 01/08/2019 CLINICAL DATA:  Abdominal pain, diagnosed with ulcers EXAM: PORTABLE CHEST 1 VIEW COMPARISON:  None. FINDINGS: The heart size and mediastinal contours are within normal limits. Both lungs are clear. The visualized skeletal structures are unremarkable. IMPRESSION: No active disease.  Electronically Signed   By: Elige Ko   On: 01/08/2019 16:11   US Abdomen Limited Ruq  Result Date: 01/09/2019 CLINICAL DATA:  Abdominal pain for 2 days EXAM: ULTRASOUND ABDOMEN LIMITED RIGHT UPPER QUADRANT COMPARISON:  CT from the previous day. FINDINGS: Gallbladder: No gallstones or wall thickening visualized. No sonographic Murphy sign noted by sonographer. Common bile duct: Diameter: 2.2 mm Liver: Diffusely increased in echogenicity consistent with fatty infiltration. Portal vein is patent on color Doppler imaging with normal direction of blood flow towards the liver. IMPRESSION: Fatty liver. No acute abnormality noted. Electronically Signed   By: Alcide Clever M.D.   On: 01/09/2019 20:05      Subjective: Patient seen and examined at bedside.  Still complains of some upper abdominal pain but feels slightly better and thinks that he might be able to go home today and wants to try soft diet.  No overnight fever or vomiting. Discharge Exam: Vitals:   01/10/19 2130 01/11/19 0558  BP: 119/78 124/85  Pulse: 63 64  Resp: 18 16  Temp: 98.9 F (37.2 C) 98.8 F (37.1 C)  SpO2: 99% 99%    General: Pt is alert, awake, not in acute distress Cardiovascular: rate controlled, S1/S2 + Respiratory: bilateral decreased breath sounds at bases Abdominal: Soft, mild epigastric tenderness present, no rebound tenderness, ND, bowel sounds + Extremities: no edema, no cyanosis    The results of significant diagnostics from this hospitalization (including  imaging, microbiology, ancillary and laboratory) are listed below for reference.     Microbiology: Recent Results (from the past 240 hour(s))  SARS Coronavirus 2 (CEPHEID - Performed in [facility] hospital lab), Hosp Order     Status: None   Collection Time: 01/08/19 10:24 PM  Result Value Ref Range Status   SARS Coronavirus 2 NEGATIVE NEGATIVE Final    Comment: (NOTE) If result is NEGATIVE SARS-CoV-2 target nucleic acids are NOT DETECTED. The  SARS-CoV-2 RNA is generally detectable in upper and lower  respiratory specimens during the acute phase of infection. The lowest  concentration of SARS-CoV-2 viral copies this assay can detect is 250  copies / mL. A negative result does not preclude SARS-CoV-2 infection  and should not be used as the sole basis for treatment or other  patient management decisions.  A negative result may occur with  improper specimen collection / handling, submission of specimen other  than nasopharyngeal swab, presence of viral mutation(s) within the  areas targeted by this assay, and inadequate number of viral copies  (<250 copies / mL). A negative result must be combined with clinical  observations, patient history, and epidemiological information. If result is POSITIVE SARS-CoV-2 target nucleic acids are DETECTED. The SARS-CoV-2 RNA is generally detectable in upper and lower  respiratory specimens dur ing the acute phase of infection.  Positive  results are indicative of active infection with SARS-CoV-2.  Clinical  correlation with patient history and other diagnostic information is  necessary to determine patient infection status.  Positive results do  not rule out bacterial infection or co-infection with other viruses. If result is PRESUMPTIVE POSTIVE SARS-CoV-2 nucleic acids MAY BE PRESENT.   A presumptive positive result was obtained on the submitted specimen  and confirmed on repeat testing.  While 2019 novel coronavirus  (SARS-CoV-2) nucleic acids may be present in the submitted sample  additional confirmatory testing may be necessary for epidemiological  and / or clinical management purposes  to differentiate between  SARS-CoV-2 and other Sarbecovirus currently known to infect humans.  If clinically indicated additional testing with an alternate test  methodology [phone]) is advised. The SARS-CoV-2 RNA is generally  detectable in upper and lower respiratory sp ecimens during the acute   phase of infection. The expected result is Negative. Fact Sheet for Patients:  BoilerBrush.com.cy Fact Sheet for Healthcare Providers: https://pope.com/ This test is not yet approved or cleared by the [place] FDA and has been authorized for detection and/or diagnosis of SARS-CoV-2 by FDA under an Emergency Use Authorization (EUA).  This EUA will remain in effect (meaning this test can be used) for the duration of the COVID-19 declaration under Section 564(b)(1) of the Act, 21 U.S.C. section 360bbb-3(b)(1), unless the authorization is terminated or revoked sooner. Performed at [facility], [address], [place], [place] [address]      Labs: BNP (last 3 results) No results for input(s): BNP in the last 8760 hours. Basic Metabolic Panel: Recent Labs  Lab 01/08/19 1821 01/09/19 0303 01/10/19 0420 01/11/19 0305  NA 135 138 136 138  K 3.6 3.5 3.3* 3.2*  CL 101 103 104 100  CO2 22 24 24 27   GLUCOSE 208* 119* 171* 123*  BUN 10 7 <5* <5*  CREATININE 0.80 0.75 0.68 0.61  CALCIUM 8.8* 8.2* 7.9* 8.3*  MG  --   --  1.8 2.1   Liver Function Tests: Recent Labs  Lab 01/08/19 1821 01/09/19 0303 01/10/19 0420 01/11/19 0305  AST  54* 45* 48* 53*  ALT 67* 58* 63* 64*  ALKPHOS 65 49 39 48  BILITOT 0.8 1.1 0.8 0.8  PROT 6.6 5.8* 5.8* 5.9*  ALBUMIN 3.8 3.2* 3.1* 3.3*   Recent Labs  Lab 01/08/19 1821  LIPASE 42   No results for input(s): AMMONIA in the last 168 hours. CBC: Recent Labs  Lab 01/08/19 1725 01/09/19 0303  WBC 5.2 5.1  NEUTROABS 3.2  --   HGB 15.5 13.9  HCT 43.7 40.3  MCV 89.0 90.2  PLT 176 161   Cardiac Enzymes: No results for input(s): CKTOTAL, CKMB, CKMBINDEX, TROPONINI in the last 168 hours. BNP: Invalid input(s): POCBNP CBG: Recent Labs  Lab 01/10/19 0753 01/10/19 1154 01/10/19 1705 01/10/19 2132 01/11/19 0739  GLUCAP 143* 99 85 166* 163*   D-Dimer No results for input(s):  DDIMER in the last 72 hours. Hgb A1c No results for input(s): HGBA1C in the last 72 hours. Lipid Profile No results for input(s): CHOL, HDL, LDLCALC, TRIG, CHOLHDL, LDLDIRECT in the last 72 hours. Thyroid function studies No results for input(s): TSH, T4TOTAL, T3FREE, THYROIDAB in the last 72 hours.  Invalid input(s): FREET3 Anemia work up No results for input(s): VITAMINB12, FOLATE, FERRITIN, TIBC, IRON, RETICCTPCT in the last 72 hours. Urinalysis    Component Value Date/Time   COLORURINE STRAW (A) 01/08/2019 1700   APPEARANCEUR CLEAR 01/08/2019 1700   LABSPEC 1.013 01/08/2019 1700   PHURINE 7.0 01/08/2019 1700   GLUCOSEU >=500 (A) 01/08/2019 1700   HGBUR NEGATIVE 01/08/2019 1700   BILIRUBINUR NEGATIVE 01/08/2019 1700   KETONESUR 5 (A) 01/08/2019 1700   PROTEINUR NEGATIVE 01/08/2019 1700   NITRITE NEGATIVE 01/08/2019 1700   LEUKOCYTESUR NEGATIVE 01/08/2019 1700   Sepsis Labs Invalid input(s): PROCALCITONIN,  WBC,  LACTICIDVEN Microbiology Recent Results (from the past 240 hour(s))  SARS Coronavirus 2 (CEPHEID - Performed in [facility] hospital lab), Hosp Order     Status: None   Collection Time: 01/08/19 10:24 PM  Result Value Ref Range Status   SARS Coronavirus 2 NEGATIVE NEGATIVE Final    Comment: (NOTE) If result is NEGATIVE SARS-CoV-2 target nucleic acids are NOT DETECTED. The SARS-CoV-2 RNA is generally detectable in upper and lower  respiratory specimens during the acute phase of infection. The lowest  concentration of SARS-CoV-2 viral copies this assay can detect is 250  copies / mL. A negative result does not preclude SARS-CoV-2 infection  and should not be used as the sole basis for treatment or other  patient management decisions.  A negative result may occur with  improper specimen collection / handling, submission of specimen other  than nasopharyngeal swab, presence of viral mutation(s) within the  areas targeted by this assay, and inadequate number of  viral copies  (<250 copies / mL). A negative result must be combined with clinical  observations, patient history, and epidemiological information. If result is POSITIVE SARS-CoV-2 target nucleic acids are DETECTED. The SARS-CoV-2 RNA is generally detectable in upper and lower  respiratory specimens dur ing the acute phase of infection.  Positive  results are indicative of active infection with SARS-CoV-2.  Clinical  correlation with patient history and other diagnostic information is  necessary to determine patient infection status.  Positive results do  not rule out bacterial infection or co-infection with other viruses. If result is PRESUMPTIVE POSTIVE SARS-CoV-2 nucleic acids MAY BE PRESENT.   A presumptive positive result was obtained on the submitted specimen  and confirmed on repeat testing.  While 2019 novel coronavirus  (  SARS-CoV-2) nucleic acids may be present in the submitted sample  additional confirmatory testing may be necessary for epidemiological  and / or clinical management purposes  to differentiate between  SARS-CoV-2 and other Sarbecovirus currently known to infect humans.  If clinically indicated additional testing with an alternate test  methodology [phone]) is advised. The SARS-CoV-2 RNA is generally  detectable in upper and lower respiratory sp ecimens during the acute  phase of infection. The expected result is Negative. Fact Sheet for Patients:  BoilerBrush.com.cy Fact Sheet for Healthcare Providers: https://pope.com/ This test is not yet approved or cleared by the [place] FDA and has been authorized for detection and/or diagnosis of SARS-CoV-2 by FDA under an Emergency Use Authorization (EUA).  This EUA will remain in effect (meaning this test can be used) for the duration of the COVID-19 declaration under Section 564(b)(1) of the Act, 21 U.S.C. section 360bbb-3(b)(1), unless the authorization is  terminated or revoked sooner. Performed at [facility], [address], [place], [place] [address]      Time coordinating discharge: 35 minutes  SIGNED:   Glade Lloyd, MD  [facility] 01/11/2019, 11:31 AM

## 2019-02-08 ENCOUNTER — Encounter: Payer: Self-pay | Admitting: Gastroenterology

## 2019-02-15 ENCOUNTER — Encounter: Payer: Self-pay | Admitting: Gastroenterology

## 2019-02-15 ENCOUNTER — Ambulatory Visit (INDEPENDENT_AMBULATORY_CARE_PROVIDER_SITE_OTHER): Payer: 59 | Admitting: Gastroenterology

## 2019-02-15 ENCOUNTER — Other Ambulatory Visit: Payer: Self-pay

## 2019-02-15 VITALS — BP 114/86 | HR 80 | Ht 63.0 in | Wt 160.5 lb

## 2019-02-15 DIAGNOSIS — R1013 Epigastric pain: Secondary | ICD-10-CM | POA: Diagnosis not present

## 2019-02-15 DIAGNOSIS — R12 Heartburn: Secondary | ICD-10-CM

## 2019-02-15 DIAGNOSIS — R112 Nausea with vomiting, unspecified: Secondary | ICD-10-CM | POA: Diagnosis not present

## 2019-02-15 DIAGNOSIS — R945 Abnormal results of liver function studies: Secondary | ICD-10-CM

## 2019-02-15 DIAGNOSIS — R7989 Other specified abnormal findings of blood chemistry: Secondary | ICD-10-CM

## 2019-02-15 MED ORDER — PANTOPRAZOLE SODIUM 40 MG PO TBEC: 40.0000 mg | DELAYED_RELEASE_TABLET | Freq: Two times a day (BID) | ORAL | 3 refills | Status: DC

## 2019-02-15 NOTE — Progress Notes (Signed)
Chief Complaint: MEG pain, nausea/vomiting, regurgitation, heartburn  Referring Provider:     Aline August, Leroy Jimenez   HPI:    Leroy Jimenez is a 43 y.o. male with a history of diabetes, hypertension, alcohol abuse, tobacco use disorder, referred to the Gastroenterology Clinic for evaluation of nausea, vomiting, MEG pain.  He was admitted 5/5-8 with these same symptoms.  CT negative for acute intra-abdominal pathology, normal lipase, treated with IV Protonix and Maalox, IV Dilaudid, IVF, and discharged home on 5/8 with referral to outpatient GI.  Was apparently also admitted in [place] in 01/2019 with similar symptoms, diagnosed with PUD.  Thinks there was a plan to perform EGD, but this was never completed.  Evaluation during recent admission notable for the following: -AST/ALT/ALP: 53/64/48 with T bili 0.8 -Protein/Albumin 5.9/3.3 -Negative viral hepatitis panel, HIV negative -Normal CBC, lipase -CT: Hepatic steatosis, otherwise unremarkable -RUQ [place]: Fatty liver infiltration, otherwise normal  Today states MEG started 4-5 months ago along with intermittent nausea and non-bloody, bilious emesis. Pain non-radiating. Improved with recent inpatient tx and discharge medications (Protonix 40 mg p.o. twice daily), but has started to have some nausea recur. + Heartburn. No EtOH since leaving hospital. Previously was 6 beers/day, and had cut down to 2 beers/week 10 days prior to admission. Not taking the pain medications that he was prescribed at time of discharge.  Does endorse intermittent black stools, with last episode 4 weeks ago.   No known family history of CRC, GI malignancy, liver disease, pancreatic disease, or IBD.    PMHx: Diabetes Hypertension Tobacco use disorder  Past Surgical History: None  History reviewed. No pertinent surgical history. History reviewed. No pertinent family history. Social History   Tobacco Use  . Smoking status: Never Smoker   . Smokeless tobacco: Never Used  Substance Use Topics  . Alcohol use: Yes  . Drug use: Not on file   Current Outpatient Medications  Medication Sig Dispense Refill  . alum & mag hydroxide-simeth (MAALOX/MYLANTA) 200-200-20 MG/5ML suspension Take 30 mLs by mouth every 4 (four) hours as needed for indigestion or heartburn. 355 mL 0  . CARAFATE 1 g tablet Take 1 g by mouth 2 (two) times a day.    . CYMBALTA 30 MG capsule Take 30 mg by mouth daily.    . ondansetron (ZOFRAN) 4 MG tablet Take 1 tablet (4 mg total) by mouth every 6 (six) hours as needed for nausea. 20 tablet 0  . oxyCODONE (OXY IR/ROXICODONE) 5 MG immediate release tablet Take 1 tablet (5 mg total) by mouth every 6 (six) hours as needed for severe pain. 20 tablet 0  . pantoprazole (PROTONIX) 40 MG tablet Take 1 tablet (40 mg total) by mouth 2 (two) times daily before a meal. 60 tablet 0  . pantoprazole (PROTONIX) 40 MG tablet Take 1 tablet (40 mg total) by mouth 2 (two) times daily. 180 tablet 3   No current facility-administered medications for this visit.    No Known Allergies   Review of Systems: All systems reviewed and negative except where noted in HPI.     Physical Exam:    Wt Readings from Last 3 Encounters:  02/15/19 160 lb 8 oz (72.8 kg)  01/09/19 157 lb 13.6 oz (71.6 kg)    BP 114/86   Pulse 80   Ht 5\' 3"  (1.6 m)   Wt 160 lb 8 oz (72.8 kg)   SpO2 98%  BMI 28.43 kg/m  Constitutional:  Pleasant, in no acute distress. Psychiatric: Normal mood and affect. Behavior is normal. EENT: Pupils normal.  Conjunctivae are normal. No scleral icterus. Neck supple. No cervical LAD. Cardiovascular: Normal rate, regular rhythm. No edema Pulmonary/chest: Effort normal and breath sounds normal. No wheezing, rales or rhonchi. Abdominal: Minimal TTP in LUQ and MEG without rebound or guarding.  Soft, nondistended. Bowel sounds active throughout. There are no masses palpable. No hepatomegaly. Neurological: Alert and  oriented to person place and time. Skin: Skin is warm and dry. No rashes noted.   ASSESSMENT AND PLAN;   Leroy Jimenez is a 43 y.o. male presenting with:  1) MEG Pain 2) Nausea/Vomiting 3) Heartburn  Clinical presentation seems most consistent with PUD/gastritis versus dyspepsia related to reflux.  Plan to evaluate and treat as below:  - EGD with bxs - Resume Protonix 40 mg PO BID for now  4) Elevated AST/ALT: Mildly elevated ALT> AST with otherwise normal ALP and T bili.  CT and RUQ US both demonstrating hepatic steatosis otherwise no duct dilatation.  Suspect fatty liver infiltration likely due to both underlying metabolic syndrome (history of hypertension diabetes) and EtOH use.  He is already cut down on EtOH.  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  Otherwise, no clinical or serologic e/o impaired hepatic synthetic function.  - Improve healthy eating habits - Repeat LAEs in 6 months - Already cut down EtOh to 2 beers/week -If LAE's increasing, then plan for extended serologic work-up to rule out concomitant liver disease  The indications, risks, and benefits of EGD were explained to the patient in detail. Risks include but are not limited to bleeding, perforation, adverse reaction to medications, and cardiopulmonary compromise. Sequelae include but are not limited to the possibility of surgery, hositalization, and mortality. The patient verbalized understanding and wished to proceed. All questions answered, referred to scheduler. Further recommendations pending results of the exam.     Leroy CleverlyVito V Alaric Gladwin, Leroy Jimenez, Leroy Jimenez  02/15/2019, 4:58 PM   Leroy Jimenez, [place], Leroy Jimenez

## 2019-02-15 NOTE — Patient Instructions (Signed)
If you are age 42 or older, your body mass index should be between 23-30. Your Body mass index is 28.43 kg/m. If this is out of the aforementioned range listed, please consider follow up with your Primary Care Provider.  If you are age 23 or younger, your body mass index should be between 19-25. Your Body mass index is 28.43 kg/m. If this is out of the aformentioned range listed, please consider follow up with your Primary Care Provider.   To help prevent the possible spread of infection to our patients, communities, and staff; we will be implementing the following measures:  As of now we are not allowing any visitors/family members to accompany you to any upcoming appointments with [facility]. If you have any concerns about this please contact our office to discuss prior to the appointment.   We have sent the following medications to your pharmacy for you to pick up at your convenience: Protonix 40mg  twice daily.  Your provider has requested that in 6 months you go to the basement level for lab work at our [place] location ([place]. [place] [address]) . Press "B" on the elevator. The lab is located at the first door on the left as you exit the elevator. You may go at whatever time is convienent for you. The current hours of operations are Monday- Friday 7:30am-4:30pm.  It was a pleasure to see you today!  Vito Cirigliano, D.O.

## 2019-02-26 ENCOUNTER — Telehealth: Payer: Self-pay | Admitting: Gastroenterology

## 2019-02-26 NOTE — Telephone Encounter (Signed)
Covid-19 Screening Questions     Do you now or have you had a fever in the last 14 days?     No   Do you have any respiratory symptoms of shortness of breath or cough now or in the last 14 days?    No   Do you have any family members or close contacts with diagnosed or suspected Covid-19 in the past 14 days?     No   Have you been tested for Covid-19 and found to be positive?    No   Pt made aware of that care partner may come to the lobby during the procedure but will need to provide their own mask.

## 2019-02-27 ENCOUNTER — Other Ambulatory Visit: Payer: Self-pay

## 2019-02-27 ENCOUNTER — Ambulatory Visit (AMBULATORY_SURGERY_CENTER): Payer: 59 | Admitting: Gastroenterology

## 2019-02-27 ENCOUNTER — Encounter: Payer: Self-pay | Admitting: Gastroenterology

## 2019-02-27 VITALS — BP 118/76 | HR 89 | Temp 98.4°F | Resp 13 | Ht 63.0 in | Wt 160.0 lb

## 2019-02-27 DIAGNOSIS — K2970 Gastritis, unspecified, without bleeding: Secondary | ICD-10-CM | POA: Diagnosis present

## 2019-02-27 DIAGNOSIS — K2990 Gastroduodenitis, unspecified, without bleeding: Secondary | ICD-10-CM

## 2019-02-27 DIAGNOSIS — K219 Gastro-esophageal reflux disease without esophagitis: Secondary | ICD-10-CM

## 2019-02-27 DIAGNOSIS — K921 Melena: Secondary | ICD-10-CM

## 2019-02-27 DIAGNOSIS — R1013 Epigastric pain: Secondary | ICD-10-CM

## 2019-02-27 DIAGNOSIS — K3189 Other diseases of stomach and duodenum: Secondary | ICD-10-CM | POA: Diagnosis not present

## 2019-02-27 DIAGNOSIS — R112 Nausea with vomiting, unspecified: Secondary | ICD-10-CM

## 2019-02-27 DIAGNOSIS — R12 Heartburn: Secondary | ICD-10-CM

## 2019-02-27 MED ORDER — PANTOPRAZOLE SODIUM 40 MG PO TBEC: 40.0000 mg | DELAYED_RELEASE_TABLET | Freq: Two times a day (BID) | ORAL | 0 refills | Status: DC

## 2019-02-27 MED ORDER — PANTOPRAZOLE SODIUM 40 MG PO TBEC: 40.0000 mg | DELAYED_RELEASE_TABLET | Freq: Every day | ORAL | 3 refills | Status: DC

## 2019-02-27 MED ORDER — SODIUM CHLORIDE 0.9 % IV SOLN: 500.0000 mL | Freq: Once | INTRAVENOUS | Status: DC

## 2019-02-27 NOTE — Progress Notes (Signed)
Called to room to assist during endoscopic procedure.  Patient ID and intended procedure confirmed with present staff. Received instructions for my participation in the procedure from the performing physician.  

## 2019-02-27 NOTE — Patient Instructions (Signed)
Thank you for allowing [place] to care for you today!  Await pathology results by mail, approximately 2 weeks.  Resume previous diet and medications today.  Start Pantoprazole (Protonix) 40 mg 2 times per day with meals for 4 weeks. Then reduce to 40 mg daily with food.  Return to GI clinic in 2 months, sooner if needed.  Resume your previous activities tomorrow.    YOU HAD AN ENDOSCOPIC PROCEDURE TODAY AT [facility]:   Refer to the procedure report that was given to you for any specific questions about what was found during the examination.  If the procedure report does not answer your questions, please call your gastroenterologist to clarify.  If you requested that your care partner not be given the details of your procedure findings, then the procedure report has been included in a sealed envelope for you to review at your convenience later.  YOU SHOULD EXPECT: Some feelings of bloating in the abdomen. Passage of more gas than usual.  Walking can help get rid of the air that was put into your GI tract during the procedure and reduce the bloating. If you had a lower endoscopy (such as a colonoscopy or flexible sigmoidoscopy) you may notice spotting of blood in your stool or on the toilet paper. If you underwent a bowel prep for your procedure, you may not have a normal bowel movement for a few days.  Please Note:  You might notice some irritation and congestion in your nose or some drainage.  This is from the oxygen used during your procedure.  There is no need for concern and it should clear up in a day or so.  SYMPTOMS TO REPORT IMMEDIATELY:        Following upper endoscopy (EGD)  Vomiting of blood or coffee ground material  New chest pain or pain under the shoulder blades  Painful or persistently difficult swallowing  New shortness of breath  Fever of 100F or higher  Black, tarry-looking stools  For urgent or emergent issues, a gastroenterologist can be reached  at any hour by calling [phone].   DIET:  We do recommend a small meal at first, but then you may proceed to your regular diet.  Drink plenty of fluids but you should avoid alcoholic beverages for 24 hours.  ACTIVITY:  You should plan to take it easy for the rest of today and you should NOT DRIVE or use heavy machinery until tomorrow (because of the sedation medicines used during the test).    FOLLOW UP: Our staff will call the number listed on your records 48-72 hours following your procedure to check on you and address any questions or concerns that you may have regarding the information given to you following your procedure. If we do not reach you, we will leave a message.  We will attempt to reach you two times.  During this call, we will ask if you have developed any symptoms of COVID 19. If you develop any symptoms (ie: fever, flu-like symptoms, shortness of breath, cough etc.) before then, please call [phone].  If you test positive for Covid 19 in the 2 weeks post procedure, please call and report this information to [place].    If any biopsies were taken you will be contacted by phone or by letter within the next 1-3 weeks.  Please call us at [phone] if you have not heard about the biopsies in 3 weeks.    SIGNATURES/CONFIDENTIALITY: You and/or your care partner  have signed paperwork which will be entered into your electronic medical record.  These signatures attest to the fact that that the information above on your After Visit Summary has been reviewed and is understood.  Full responsibility of the confidentiality of this discharge information lies with you and/or your care-partner.

## 2019-02-27 NOTE — Progress Notes (Signed)
PT taken to PACU. Monitors in place. VSS. Report given to RN. 

## 2019-02-27 NOTE — Op Note (Signed)
[facility] Patient Name: Leroy HawthorneLeonardo Gonzalez Jimenez Procedure Date: 02/27/2019 9:25 AM MRN: [id number] Endoscopist: Doristine LocksVito Jayvien Rowlette , MD Age: [age] Referring MD:  Date of Birth: 1976/05/08 Gender: Male Account #: [id number] Procedure:                Upper GI endoscopy Indications:              Epigastric abdominal pain, Heartburn, Melena,                            Nausea with vomiting                           Admitted 5/5-8 with MEG pain, nausea, vomiting,                            started on Protonix 40 mg PO BID with overall                            improvement, but not complete resolution of                            symptoms. 1 episode of melena approximatley 4 weeks                            ago. Does endorse intermittent heartburn. Medicines:                Monitored Anesthesia Care Procedure:                Pre-Anesthesia Assessment:                           - Prior to the procedure, a History and Physical                            was performed, and patient medications and                            allergies were reviewed. The patient's tolerance of                            previous anesthesia was also reviewed. The risks                            and benefits of the procedure and the sedation                            options and risks were discussed with the patient.                            All questions were answered, and informed consent                            was obtained. Prior Anticoagulants: The patient has  taken no previous anticoagulant or antiplatelet                            agents. ASA Grade Assessment: II - A patient with                            mild systemic disease. After reviewing the risks                            and benefits, the patient was deemed in                            satisfactory condition to undergo the procedure.                           After obtaining informed consent, the endoscope  was                            passed under direct vision. Throughout the                            procedure, the patient's blood pressure, pulse, and                            oxygen saturations were monitored continuously. The                            Endoscope was introduced through the mouth, and                            advanced to the second part of duodenum. The upper                            GI endoscopy was accomplished without difficulty.                            The patient tolerated the procedure well. Scope In: Scope Out: Findings:                 The Z-line was mildly irregular and was found 38 cm                            from the incisors with a single small island of                            salmon colored mucosa located immediately proximal                            to the Z line. Biopsies were taken with a cold                            forceps for histology. Estimated blood loss was  minimal.                           The upper third of the esophagus and middle third                            of the esophagus were normal.                           Esophagogastric landmarks were identified: the                            Z-line was found at 38 cm, the gastroesophageal                            junction was found at 38 cm and the site of hiatal                            narrowing was found at 38 cm from the incisors.                           Scattered mild inflammation characterized by                            erythema was found in the gastric fundus, in the                            gastric body, at the incisura and in the gastric                            antrum. Biopsies were taken with a cold forceps for                            Helicobacter pylori testing. Estimated blood loss                            was minimal.                           The duodenal bulb, first portion of the duodenum                             and second portion of the duodenum were normal. Complications:            No immediate complications. Estimated Blood Loss:     Estimated blood loss was minimal. Impression:               - Z-line mildly irregular, with a single small                            island of salmon colored mucosa, located 38 cm from                            the incisors. Biopsied.                           -  Normal upper third of esophagus and middle third                            of esophagus.                           - Esophagogastric landmarks identified.                           - Gastritis. Biopsied.                           - Normal duodenal bulb, first portion of the                            duodenum and second portion of the duodenum. Recommendation:           - Patient has a contact number available for                            emergencies. The signs and symptoms of potential                            delayed complications were discussed with the                            patient. Return to normal activities tomorrow.                            Written discharge instructions were provided to the                            patient.                           - Resume previous diet.                           - Continue present medications.                           - Await pathology results.                           - Use Protonix (pantoprazole) 40 mg PO BID for 4                            more weeks to promote further mucosal healing, then                            reduce to 40 mg daily. Will continue to titrate to                            the lowest effective dose to control reflux  symptoms (heartburn).                           - Return to GI clinic in 2 months, or sooner prn. Doristine Locks, MD 02/27/2019 9:45:23 AM

## 2019-02-27 NOTE — Progress Notes (Signed)
Pt's states no medical or surgical changes since previsit or office visit. 

## 2019-03-01 ENCOUNTER — Telehealth: Payer: Self-pay

## 2019-03-01 NOTE — Telephone Encounter (Signed)
Have you developed a fever since your procedure? No. 2.   Have you had an respiratory symptoms (SOB or cough) since your procedure? No.  3.   Have you tested positive for COVID 19 since your procedure No.  4.   Have you had any family members/close contacts diagnosed with the COVID 19 since your procedure?  No.   If yes to any of these questions please route to Joylene John, RN and Alphonsa Gin, RN.  Follow up Call-  Pt. Did have difficulty understanding my call.  I spoke slowly and rephrased the questions to the degree I felt would convey accurate responses on his part.   Best I could understand, the pt. Comprehended the questions, and these replies are accurate.  Call back number 02/27/2019  Post procedure Call Back phone  # [phone]  Permission to leave phone message Yes     Patient questions:  Do you have a fever, pain , or abdominal swelling? No. Pain Score  0 *  Have you tolerated food without any problems? Yes.    Have you been able to return to your normal activities? Yes.    Do you have any questions about your discharge instructions: Diet   No. Medications  No. Follow up visit  No.  Do you have questions or concerns about your Care? No.  Actions: * If pain score is 4 or above: No action needed, pain <4.

## 2019-04-22 ENCOUNTER — Telehealth: Payer: Self-pay

## 2019-04-22 ENCOUNTER — Other Ambulatory Visit: Payer: Self-pay

## 2019-04-22 ENCOUNTER — Ambulatory Visit: Payer: 59 | Admitting: Gastroenterology

## 2019-04-22 ENCOUNTER — Encounter: Payer: Self-pay | Admitting: Gastroenterology

## 2019-04-22 VITALS — BP 122/86 | HR 102 | Temp 98.6°F | Ht 63.0 in | Wt 166.0 lb

## 2019-04-22 DIAGNOSIS — R740 Nonspecific elevation of levels of transaminase and lactic acid dehydrogenase [LDH]: Secondary | ICD-10-CM

## 2019-04-22 DIAGNOSIS — R112 Nausea with vomiting, unspecified: Secondary | ICD-10-CM | POA: Diagnosis not present

## 2019-04-22 DIAGNOSIS — K2100 Gastro-esophageal reflux disease with esophagitis, without bleeding: Secondary | ICD-10-CM

## 2019-04-22 DIAGNOSIS — K3189 Other diseases of stomach and duodenum: Secondary | ICD-10-CM | POA: Diagnosis not present

## 2019-04-22 DIAGNOSIS — R7401 Elevation of levels of liver transaminase levels: Secondary | ICD-10-CM

## 2019-04-22 DIAGNOSIS — K31A0 Gastric intestinal metaplasia, unspecified: Secondary | ICD-10-CM

## 2019-04-22 MED ORDER — PANTOPRAZOLE SODIUM 20 MG PO TBEC: 20.0000 mg | DELAYED_RELEASE_TABLET | Freq: Every day | ORAL | 3 refills | Status: DC

## 2019-04-22 NOTE — Telephone Encounter (Signed)
Covid-19 screening questions   Do you now or have you had a fever in the last 14 days? No  Do you have any respiratory symptoms of shortness of breath or cough now or in the last 14 days? No  Do you have any family members or close contacts with diagnosed or suspected Covid-19 in the past 14 days? No  Have you been tested for Covid-19 and found to be positive? Was tested and found to be negative        

## 2019-04-22 NOTE — Patient Instructions (Addendum)
If you are age 42 or older, your body mass index should be between 23-30. Your Body mass index is 29.41 kg/m. If this is out of the aforementioned range listed, please consider follow up with your Primary Care Provider.  If you are age 73 or younger, your body mass index should be between 19-25. Your Body mass index is 29.41 kg/m. If this is out of the aformentioned range listed, please consider follow up with your Primary Care Provider.   Please go to the lab at [facility] You will need to go to level "B", you do not need an appointment for this. Hours available are 7:30 am - 4:30 pm. This will need to be completed in December.   We have sent the following medications to your pharmacy for you to pick up at your convenience: Protonix 40 mg once daily x 4 weeks and then Protonix 20 mg once daily.   Follow up in 6 months.   It was a pleasure to see you today!  Vito Cirigliano, D.O.

## 2019-04-22 NOTE — Progress Notes (Signed)
P  Chief Complaint:    Gastric Intestinal Metaplasia, nausea/vomiting, heartburn  GI History: 43 y.o. male with a history of diabetes, hypertension, alcohol abuse, tobacco use disorder,  initially referred to the Gastroenterology Clinic in 02/2019 for evaluation of nausea/vomiting, MEG pain.  He was admitted 5/5-8 with these same symptoms.  CT negative for acute intra-abdominal pathology, normal lipase, treated with IV Protonix and Maalox, IV Dilaudid, IVF, and discharged home on 5/8 with referral to outpatient GI.  Was apparently also admitted in [place] in 01/2019 with similar symptoms, diagnosed with PUD.  Thinks there was a plan to perform EGD, but this was never completed.  Evaluation during recent admission notable for the following: -AST/ALT/ALP: 53/64/48 with T bili 0.8 -Negative viral hepatitis panel, HIV negative -Normal CBC, lipase -CT: Hepatic steatosis, otherwise unremarkable -RUQ [place]: Fatty liver infiltration, otherwise normal  Endorses heartburn, now resolved since starting PPI therapy.  He had reduced EtOH consumption to 2 beers/week, and now essentially stopped drinking 3 weeks ago.  Endoscopic history: -EGD (02/2019, Dr. Bryan Lemma): Mildly irregular Z line (biopsy: Reflux changes without Barrett's), mild, non-H. pylori gastritis, normal duodenum.  Gastric biopsies with Intestinal Metaplasia  HPI:     Patient is a 43 y.o. male presenting to the Gastroenterology Clinic for follow-up.  He was initially seen in 02/2019 for MEG pain, n/v, heartburn.  EGD in 02/2019 notable for mild reflux changes at the Z line, and Gastric Intestinal Metaplasia without dysplasia or H. pylori.  He presents today to discuss this finding.  Was treated with Protonix 40 mg p.o. twice daily x8 weeks, with plan to titrate down to 40 mg/day.  Today states he feels much improved. Heartburn resolved. Had a single episode of nausea without emesis last week. Otherwise, feels well. Melena  resolved without recurrence. No abdominal pain.  Tolerating all p.o. intake.  Continues to take Protonix bid as prescribed.  Last beer was 3 weeks ago.  Review of systems:     No chest pain, no SOB, no fevers, no urinary sx   Past Medical History:  Diagnosis Date  . Diabetes mellitus without complication ([place])     Patient's surgical history, family medical history, social history, medications and allergies were all reviewed in Epic    Current Outpatient Medications  Medication Sig Dispense Refill  . CARAFATE 1 g tablet Take 1 g by mouth 2 (two) times a day.    . metFORMIN (GLUCOPHAGE) 1000 MG tablet 1 tablet 2 (two) times daily.    Marland Kitchen oxyCODONE (OXY IR/ROXICODONE) 5 MG immediate release tablet Take 1 tablet (5 mg total) by mouth every 6 (six) hours as needed for severe pain. 20 tablet 0  . pantoprazole (PROTONIX) 40 MG tablet Take 1 tablet (40 mg total) by mouth 2 (two) times daily. 180 tablet 3  . sildenafil (REVATIO) 20 MG tablet 1 tablet as needed.     No current facility-administered medications for this visit.     Physical Exam:     There were no vitals taken for this visit.  GENERAL:  Pleasant male in NAD PSYCH: : Cooperative, normal affect EENT:  conjunctiva pink, mucous membranes moist, neck supple without masses CARDIAC:  RRR, no murmur heard, no peripheral edema PULM: Normal respiratory effort, lungs CTA bilaterally, no wheezing ABDOMEN:  Nondistended, soft, nontender. No obvious masses, no hepatomegaly,  normal bowel sounds SKIN:  turgor, no lesions seen Musculoskeletal:  Normal muscle tone, normal strength NEURO: Alert and oriented x 3, no focal neurologic deficits  IMPRESSION and PLAN:   Impression and plan: #1. GERD with erosive esophagitis: - Reduce Protonix to 40 mg/day x4 weeks, and if heartburn still well controlled, will further reduce to 20 mg/day -Resume antireflux lifestyle measures - Has essentially stopped drinking which undoubtedly contributes to  his overall improvement  #2.  Gastric Intestinal Metaplasia: - Repeat EGD in 3 years with GIM mapping for surveillance based on current societal recommendations  #3. Nausea/Vomiting: - Resolved with PPI therapy - Ok to continue to advance PO as tolerated  #4 Elevated AST/ALT: Mildly elevated ALT> AST with otherwise normal ALP and T bili.  CT and RUQ US both demonstrating hepatic steatosis otherwise no duct dilatation.  Suspect fatty liver infiltration likely due to both underlying metabolic syndrome (history of hypertension diabetes) and EtOH use.    He has now essentially stopped drinking. 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  Otherwise, no clinical or serologic e/o impaired hepatic synthetic function.  - Improve healthy eating habits - Repeat LAEs in 6 months -Applauded him on his stoppage of all EtOH intake. -If LAE's increasing, then plan for extended serologic work-up to rule out concomitant liver disease  RTC in 6 months or sooner as needed  I spent a total of 25 minutes of face-to-face time with the patient. Greater than 50% of the time was spent counseling and coordinating care.       Shellia CleverlyVito V Dayan Desa ,DO, FACG 04/22/2019, 1:55 PM

## 2019-10-28 ENCOUNTER — Telehealth: Payer: Self-pay

## 2019-10-28 NOTE — Telephone Encounter (Signed)
Approved through 10/24/2020  PA# advanced control formulary-[employer] [place] ppo [id number] ss

## 2020-01-14 ENCOUNTER — Ambulatory Visit: Payer: 59 | Admitting: Gastroenterology

## 2020-02-06 ENCOUNTER — Ambulatory Visit (INDEPENDENT_AMBULATORY_CARE_PROVIDER_SITE_OTHER): Payer: Self-pay | Admitting: Gastroenterology

## 2020-02-06 ENCOUNTER — Other Ambulatory Visit: Payer: Self-pay

## 2020-02-06 ENCOUNTER — Encounter: Payer: Self-pay | Admitting: Gastroenterology

## 2020-02-06 VITALS — BP 136/84 | HR 95 | Temp 98.6°F | Ht 63.0 in | Wt 165.1 lb

## 2020-02-06 DIAGNOSIS — G8929 Other chronic pain: Secondary | ICD-10-CM

## 2020-02-06 DIAGNOSIS — R6881 Early satiety: Secondary | ICD-10-CM

## 2020-02-06 DIAGNOSIS — R1013 Epigastric pain: Secondary | ICD-10-CM

## 2020-02-06 DIAGNOSIS — R110 Nausea: Secondary | ICD-10-CM

## 2020-02-06 DIAGNOSIS — R1032 Left lower quadrant pain: Secondary | ICD-10-CM

## 2020-02-06 DIAGNOSIS — K760 Fatty (change of) liver, not elsewhere classified: Secondary | ICD-10-CM

## 2020-02-06 DIAGNOSIS — R7401 Elevation of levels of liver transaminase levels: Secondary | ICD-10-CM

## 2020-02-06 MED ORDER — PANTOPRAZOLE SODIUM 20 MG PO TBEC: 20.0000 mg | DELAYED_RELEASE_TABLET | Freq: Every day | ORAL | 3 refills | Status: AC

## 2020-02-06 MED ORDER — ONDANSETRON HCL 4 MG PO TABS: 4.0000 mg | ORAL_TABLET | Freq: Four times a day (QID) | ORAL | 0 refills | Status: AC | PRN

## 2020-02-06 NOTE — Progress Notes (Signed)
P  Chief Complaint:    Nausea, abdominal pain, ER follow-up  GI History: 44 y.o.malewith a history of diabetes, hypertension, alcohol abuse, tobacco use disorder, initially referred to the Gastroenterology Clinic in 02/2019 for evaluation ofnausea/vomiting, MEG pain. He was admitted 5/5-8, 2020 with thesesame symptoms. CT negative for acute intra-abdominal pathology, normal lipase, treated with IV Protonix and Maalox, IV Dilaudid, IVF, and discharged home on 5/8 with referral to outpatient GI.  Was apparently also admitted in [place] in 01/2019 with similar symptoms, diagnosed with PUD.  Evaluation during 01/2019 admission notable for the following: -AST/ALT/ALP: 53/64/48 with T bili 0.8 -Negative viral hepatitis panel, HIV negative -Normal CBC, lipase -CT: Hepatic steatosis, otherwise unremarkable -RUQ [place]: Fatty liver infiltration, otherwise normal  GI symptoms essentially resolved with PPI follow-up in 04/2019 with subsequent recurrence earlier this year as outlined below.  He had essentially stopped drinking  in 04/2019.  Endoscopic history: -EGD (02/2019, Dr. Bryan Lemma): Mildly irregular Z line (biopsy: Reflux changes without Barrett's), mild, non-H. pylori gastritis, normal duodenum.  Gastric biopsies with Intestinal Metaplasia -EGD (10/2018, Dr. Patsey Berthold at [facility]): Small linear ulcers in distal esophagus, likely reflux esophagitis, erythematous, edematous mucosa in proximal stomach, patchy erythema duodenum  HPI:     Patient is a 44 y.o. male presenting to the Gastroenterology Clinic for follow-up.  Last seen by me on 04/22/2019.  Was feeling well at that time with resolution of MEG pain, nausea/vomiting, and heartburn with Protonix 40 mg bid x8 weeks.  Had stopped drinking 3 weeks prior.  Decrease Protonix to 40 mg/day x4 weeks with plan for continued titration to 20 mg/day for ongoing control of reflux.  Recommended repeat EGD in 3 years for GIM mapping  along with repeat liver enzymes in 6 months.  He presents today for evaluation of acute, recurrent abdominal pain and nausea/vomiting. Will have nausea in most mornings, but only rare episodes of nonbloody emesis.  Nausea associated with nonradiating MEG pain.  Will have intermittent LLQ pain that can accompany nausea, lasting 30-40 minutes.  Pain tends to have brought distribution in left abdomen.  No hematochezia, melena. No fever, chills. Decreased appetite and now with early satiety, which is new for him. Minimal improvement with Protonix and Carafate.   Has started EtOH again, approx 2 beers 2 times/week.   Last A1c was 6.7% in 10/2018.  Has been seen in the ER at [facility] multiple times (10/22/2019, 12/19/2019, 12/20/2019, 12/26/2019, 12/28/2019) for abdominal pain, nausea/vomiting.  Has been treated with Protonix, Compazine, Phenergan, Carafate, Bentyl, Imodium (diarrhea on most recent ER eval), IV fluids, IV morphine.  Was given Haldol for tachycardia/abdominal pain on most recent ER eval.  -12/28/2019: Normal CBC.  AST/ALT 127/171, otherwise normal CMP -12/26/2019: Normal CBC, AST/ALT 117/127, ALP 143, otherwise normal CMP.  CT abdomen/pelvis with hepatic steatosis and small HH, otherwise unremarkable -12/20/2019: Normal CBC, AST/ALT 88/99, ALP 138, otherwise normal CMP, lipase 60 -12/19/2019: AST/ALT 80/93, ALP 111, otherwise normal CMP.  CT abdomen/pelvis with hepatic steatosis otherwise unremarkable -10/22/2019: Normal CBC, AST/ALT 76/82, otherwise normal CMP, CXR norma, CT angiogram C/A/P: Unremarkable  -01/11/2019: AST/ALT 53/64, otherwise normal liver enzymes  History obtained Via Spanish interpreter in the room.  Review of systems:     No chest pain, no SOB, no fevers, no urinary sx   Past Medical History:  Diagnosis Date  . Diabetes mellitus without complication (HCC)     Patient's surgical history, family medical history, social history, medications and allergies were all  reviewed  in Epic    Current Outpatient Medications  Medication Sig Dispense Refill  . CARAFATE 1 g tablet Take 1 g by mouth 2 (two) times a day.    . metFORMIN (GLUCOPHAGE) 1000 MG tablet 1 tablet 2 (two) times daily.    Marland Kitchen oxyCODONE (OXY IR/ROXICODONE) 5 MG immediate release tablet Take 1 tablet (5 mg total) by mouth every 6 (six) hours as needed for severe pain. 20 tablet 0  . pantoprazole (PROTONIX) 20 MG tablet Take 1 tablet (20 mg total) by mouth daily. 30 tablet 3  . sildenafil (REVATIO) 20 MG tablet 1 tablet as needed.     No current facility-administered medications for this visit.    Physical Exam:     Temp 98.6 F (37 C)   Ht 5\' 3"  (1.6 m)   Wt 165 lb 2 oz (74.9 kg)   BMI 29.25 kg/m   GENERAL:  Pleasant male in NAD PSYCH: : Cooperative, normal affect EENT:  conjunctiva pink, mucous membranes moist, neck supple without masses CARDIAC:  RRR, no murmur heard, no peripheral edema PULM: Normal respiratory effort, lungs CTA bilaterally, no wheezing ABDOMEN: TTP in MEG and LLQ.  Equivocal Carnett's sign in LLQ.  No guarding or peritoneal signs.  Nondistended, soft. No obvious masses, no hepatomegaly,  normal bowel sounds SKIN:  turgor, no lesions seen Musculoskeletal:  Normal muscle tone, normal strength NEURO: Alert and oriented x 3, no focal neurologic deficits   IMPRESSION and PLAN:    1) Nausea/Vomiting 2) Epigastric pain 3) Left-sided abdominal pain/LLQ pain 4) Early satiety  Has had multiple evaluations in the ER over the last several months, mostly unremarkable aside from elevated liver enzymes as below.  No appreciable response to trial of multiple medications to include PPI, antiemetics, Carafate, etc.  EGD x2 in 2020 with non-H. pylori gastritis and reflux esophagitis.  Plan as follows:  -Placed refill for Protonix -GES.  If unremarkable, will consider repeat EGD to again evaluate for gastritis, PUD, atypical reflux changes, GOO -Pain seems a bit too broad  and less predictable, so lower suspicion for abdominal wall syndrome which would respond to wall injection, but could consider in future pending eval -Has had multiple CTs over last few months which have been otherwise largely unremarkable; no repeat imaging indicated at this juncture -Refilled Zofran as this has worked for him before -Aside from LLQ pain, no lower GI symptoms, but may consider colonoscopy if ongoing, persistent symptoms and eval unremarkable -Could consider trial of peppermint oil or FD guard  5) Elevated liver enzymes 6) Hepatic steatosis by imaging  ALT predominant elevated liver enzymes since 10/2018 (AST/ALT 55/54) but more recently elevated with recent ER evaluations, including peak on 12/28/2019 of 127/171.  Intermittent mild elevation of ALP, with otherwise normal T bili.  Otherwise normal PLT, albumin, protein.  Imaging with hepatic steatosis on multiple CTs and on RUQ 12/30/2019 in 2020, but otherwise no evidence of portal hypertension or clinical/serologic e/o impaired hepatic synthetic function.  Does drink 2 beers 2 nights per week; prior heavier use.  -Repeat liver enzymes now.  If still elevated, plan for extended serologic work-up. -Discussed EtOH history  I spent 35 minutes of time, including in depth chart review, independent review of results as outlined above, communicating results with the patient directly, face-to-face time with the patient, coordinating care, and ordering studies and medications as appropriate, and documentation.       2021 ,DO, FACG 02/06/2020, 11:49 AM

## 2020-02-06 NOTE — Patient Instructions (Signed)
You have been scheduled for a gastric emptying scan at [facility] on 02/25/20 at 930am. Please arrive at least 15 minutes prior to your appointment for registration. Please make certain not to have anything to eat or drink after midnight the night before your test. Hold all stomach medications (ex: Zofran, phenergan, Reglan) 48 hours prior to your test. If you need to reschedule your appointment, please contact radiology scheduling at [phone]. _____________________________________________________________________ A gastric-emptying study measures how long it takes for food to move through your stomach. There are several ways to measure stomach emptying. In the most common test, you eat food that contains a small amount of radioactive material. A scanner that detects the movement of the radioactive material is placed over your abdomen to monitor the rate at which food leaves your stomach. This test normally takes about 4 hours to complete. _____________________________________________________________________   Your provider has requested that you go to the basement level for lab work at [address] [place] [address]. Press "B" on the elevator. The lab is located at the first door on the left as you exit the elevator.  It was a pleasure to see you today!  Vito Cirigliano, D.O.

## 2020-02-25 ENCOUNTER — Other Ambulatory Visit: Payer: Self-pay

## 2020-02-25 ENCOUNTER — Ambulatory Visit (HOSPITAL_COMMUNITY)
Admission: RE | Admit: 2020-02-25 | Discharge: 2020-02-25 | Disposition: A | Discharge status: Home / Self Care | Payer: No Typology Code available for payment source | Source: Ambulatory Visit | Attending: Gastroenterology | Admitting: Gastroenterology

## 2020-02-25 DIAGNOSIS — R110 Nausea: Secondary | ICD-10-CM | POA: Diagnosis present

## 2020-02-25 DIAGNOSIS — R1013 Epigastric pain: Secondary | ICD-10-CM | POA: Diagnosis present

## 2020-02-25 DIAGNOSIS — G8929 Other chronic pain: Secondary | ICD-10-CM | POA: Diagnosis present

## 2020-02-25 DIAGNOSIS — R1032 Left lower quadrant pain: Secondary | ICD-10-CM

## 2020-02-25 DIAGNOSIS — R6881 Early satiety: Secondary | ICD-10-CM | POA: Diagnosis present

## 2020-02-25 MED ORDER — TECHNETIUM TC 99M SULFUR COLLOID
2.1000 | Freq: Once | INTRAVENOUS | Status: AC | PRN
  Administered 2020-02-25: 2.1 via INTRAVENOUS

## 2021-02-07 IMAGING — DX PORTABLE CHEST - 1 VIEW
1 series · 1 of 1 positions shown · non-contrast
Comparison: None.

CLINICAL DATA: Abdominal pain, diagnosed with ulcers

EXAM:
PORTABLE CHEST 1 VIEW

[chest ap]
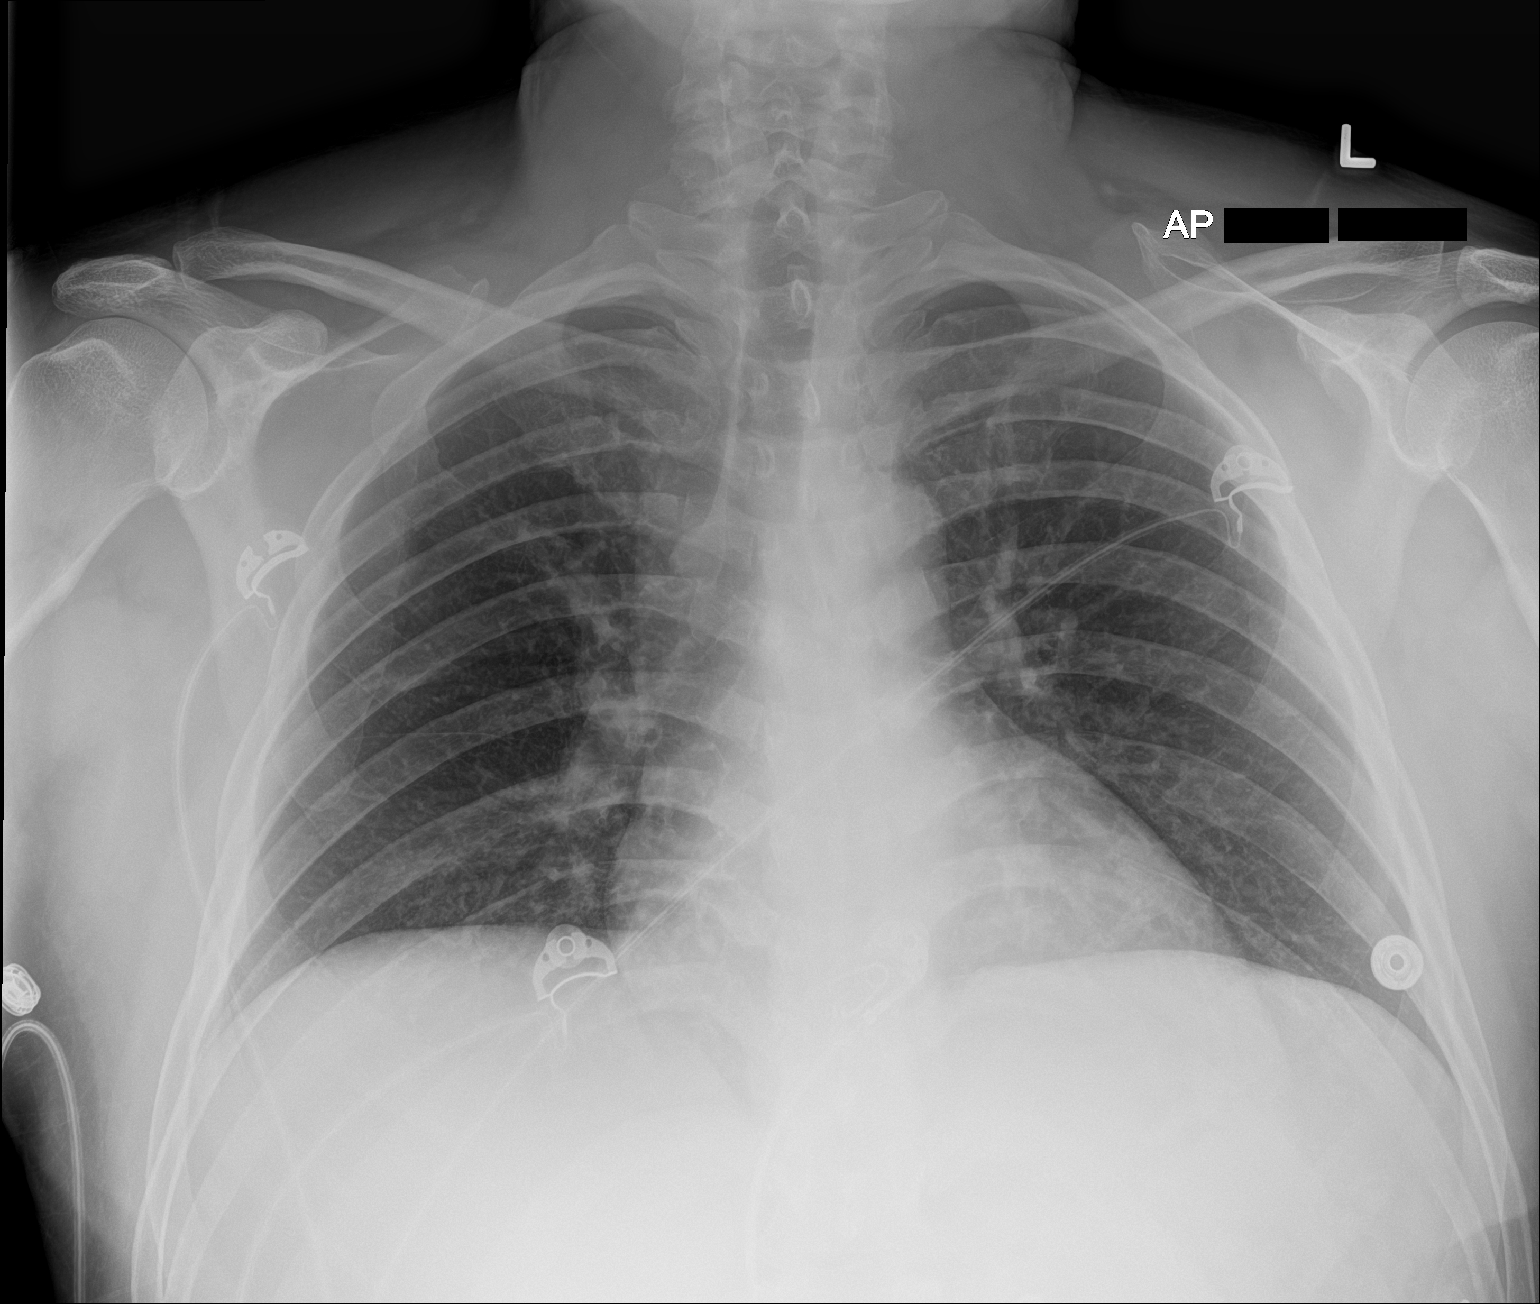

[1 of 1 positions shown; findings below may reference images not displayed]

FINDINGS: The heart size and mediastinal contours are within normal limits.
Both lungs are clear. The visualized skeletal structures are
unremarkable.
IMPRESSION: No active disease.

## 2021-02-08 IMAGING — US ULTRASOUND ABDOMEN LIMITED
1 series · 14 of 25 positions shown · non-contrast
Comparison: CT from the previous day.

CLINICAL DATA: Abdominal pain for 2 days

EXAM:
ULTRASOUND ABDOMEN LIMITED RIGHT UPPER QUADRANT

[Series 1: ultrasound abdomen limited · 14 of 50 slices shown]
[im 1/50]
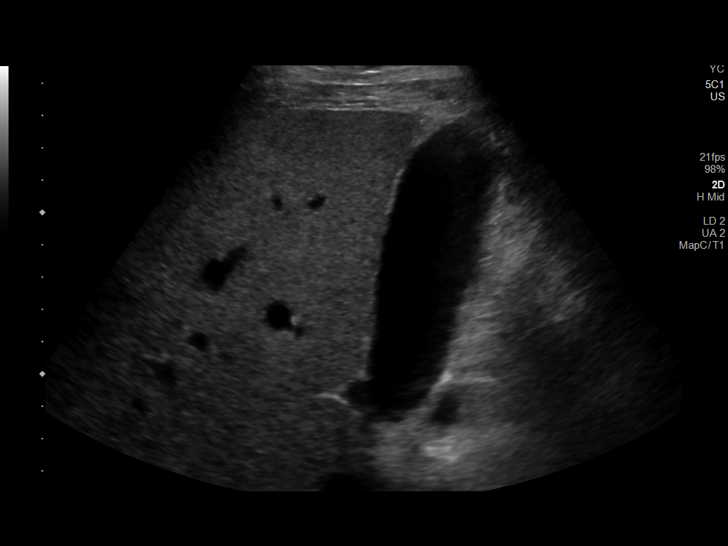
[im 5/50]
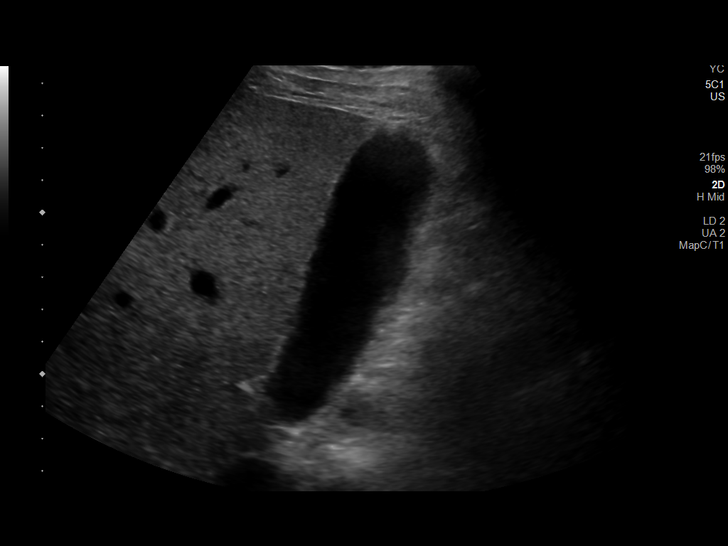
[im 9/50]
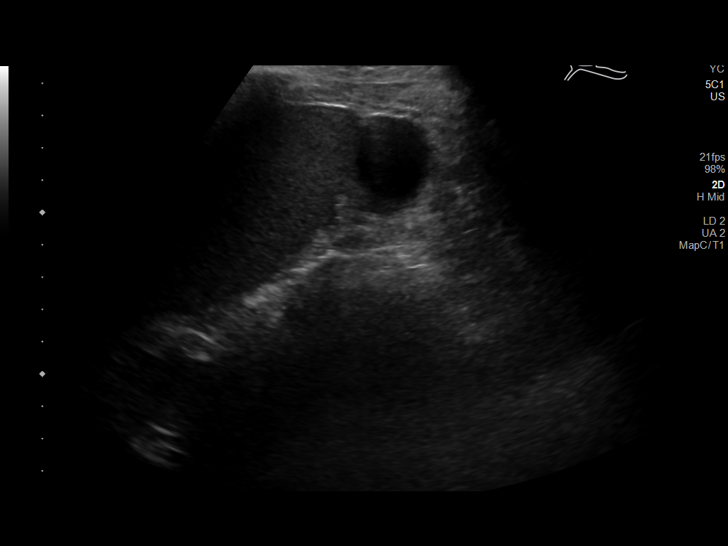
[im 13/50]
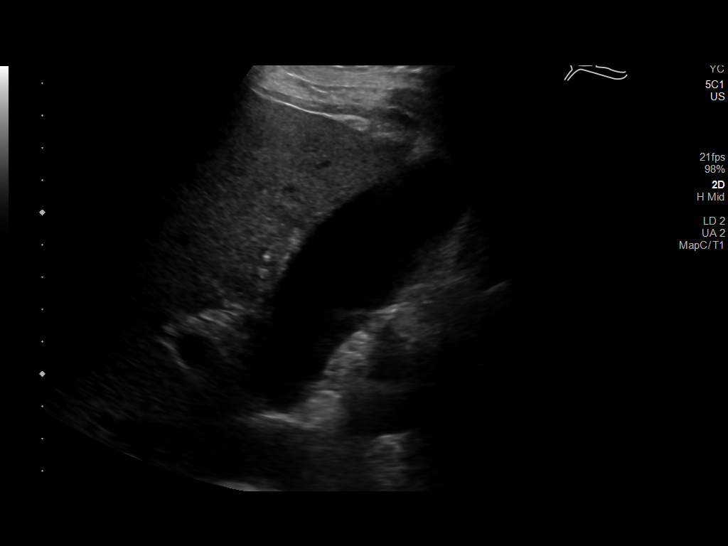
[im 17/50]
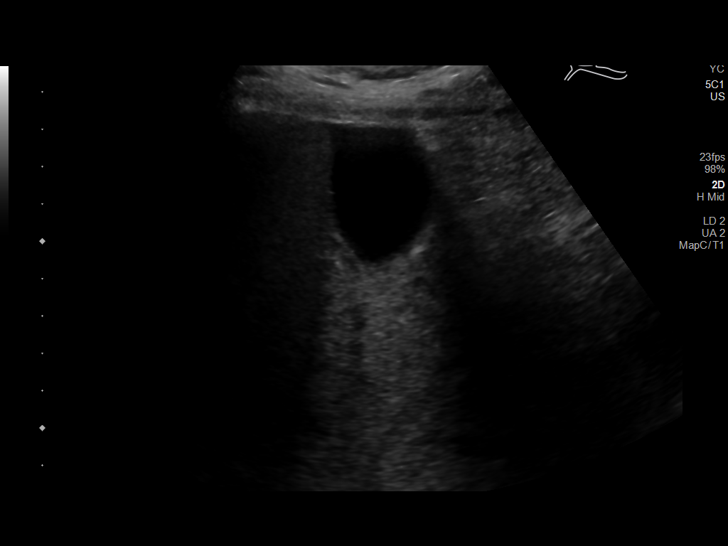
[im 19/50]
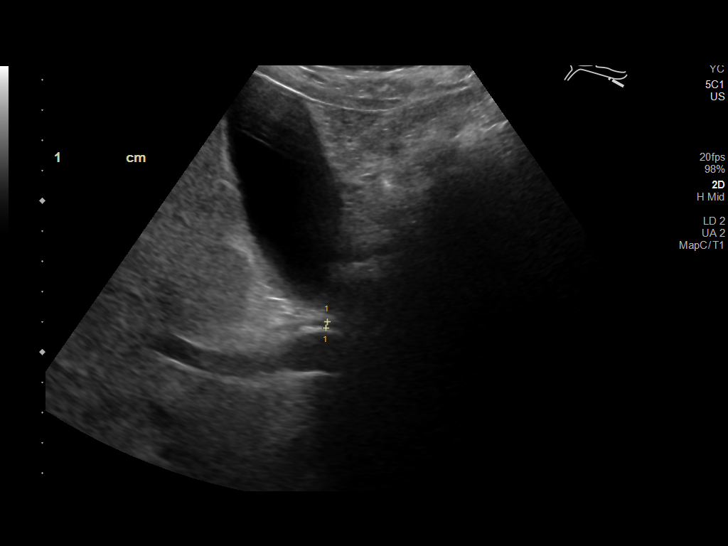
[im 23/50]
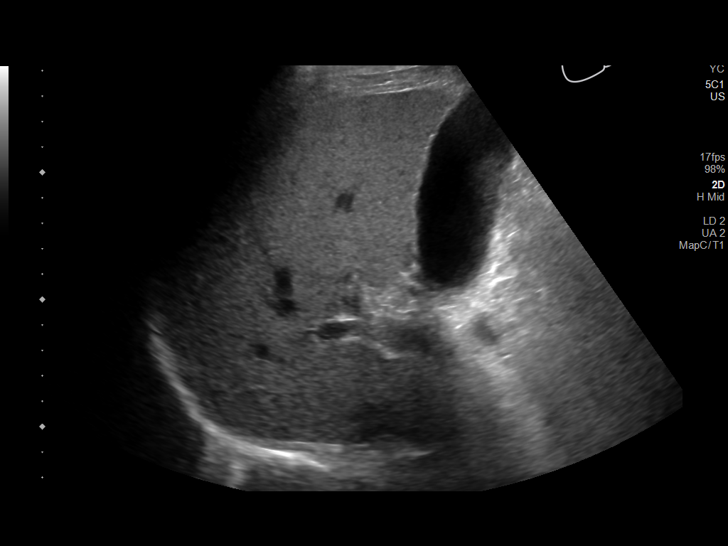
[im 27/50]
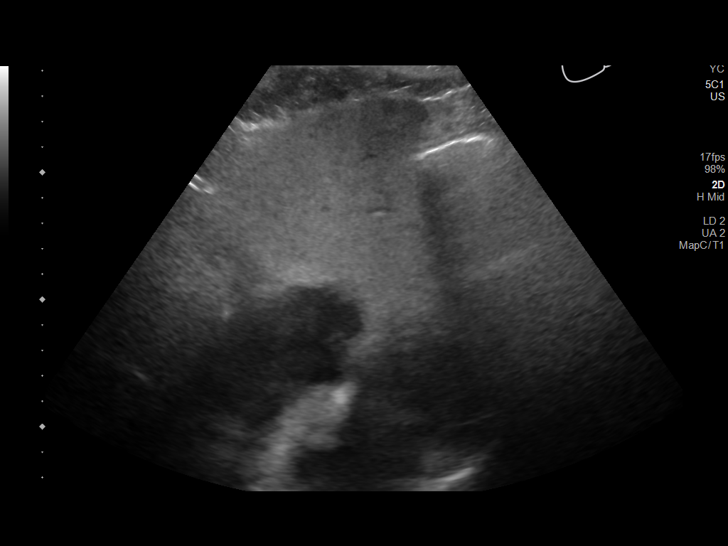
[im 31/50]
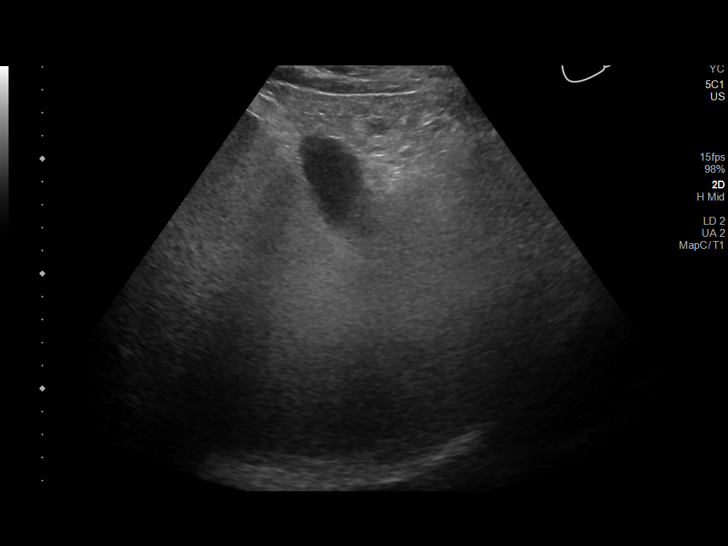
[im 33/50]
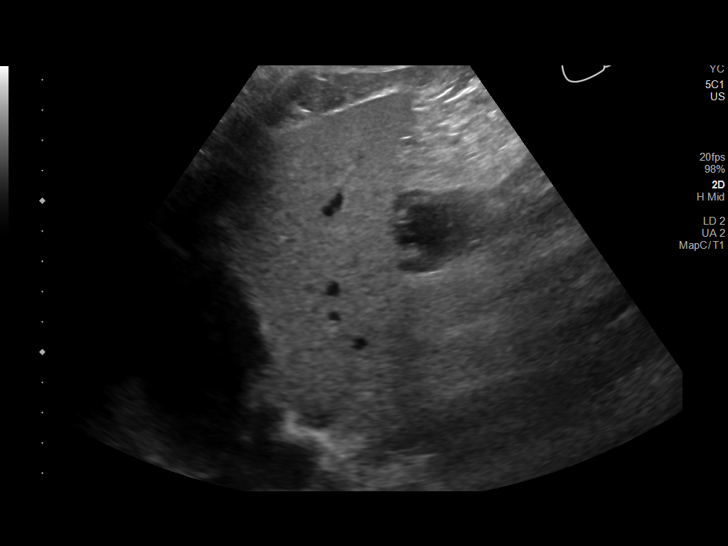
[im 37/50]
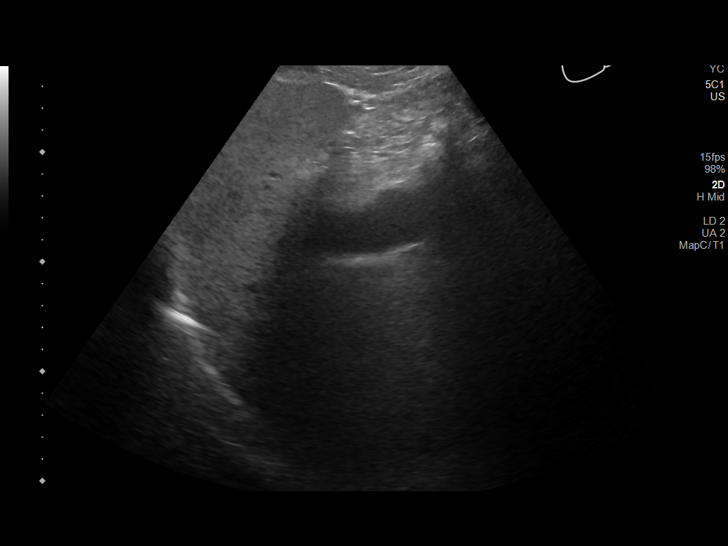
[im 41/50]
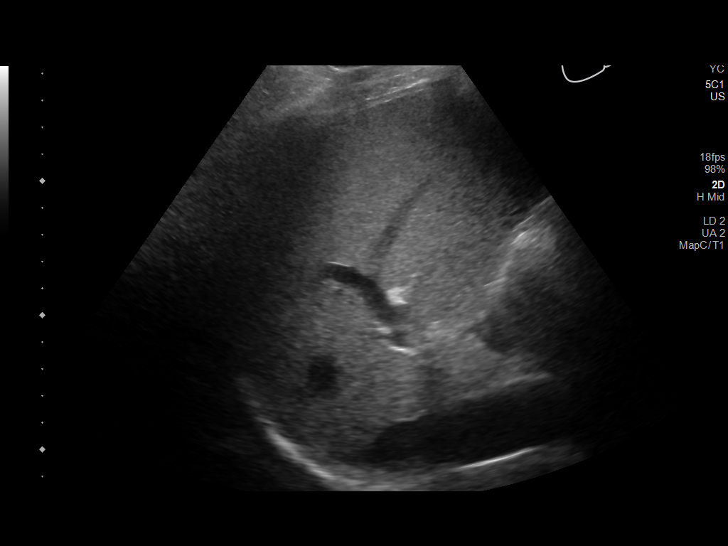
[im 45/50]
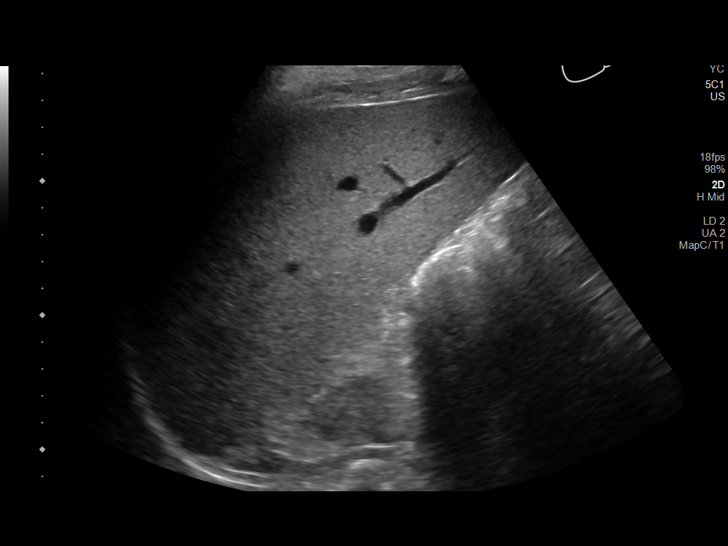
[im 50/50]
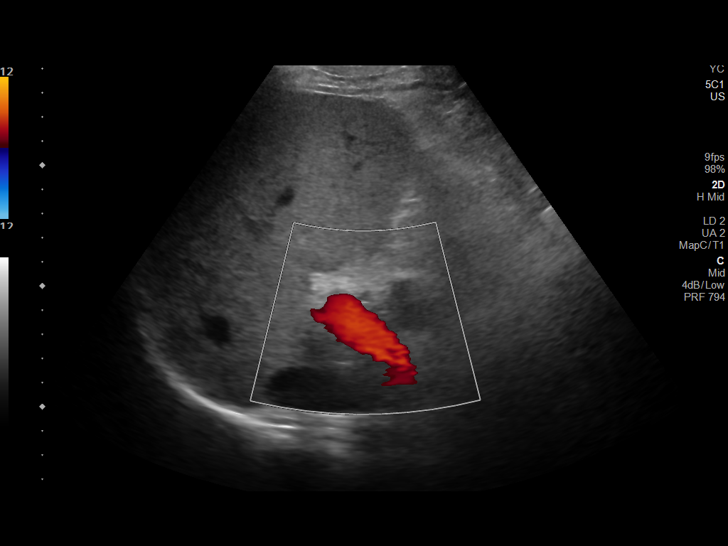

[14 of 25 positions shown; findings below may reference images not displayed]

FINDINGS: Gallbladder:

No gallstones or wall thickening visualized. No sonographic Murphy
sign noted by sonographer.

Common bile duct:

Diameter: 2.2 mm

Liver:

Diffusely increased in echogenicity consistent with fatty
infiltration. Portal vein is patent on color Doppler imaging with
normal direction of blood flow towards the liver.
IMPRESSION: Fatty liver.

No acute abnormality noted.

## 2021-06-24 ENCOUNTER — Ambulatory Visit: Payer: No Typology Code available for payment source | Admitting: Gastroenterology

## 2021-07-27 ENCOUNTER — Ambulatory Visit: Payer: Self-pay | Admitting: Gastroenterology

## 2024-09-13 ENCOUNTER — Other Ambulatory Visit (HOSPITAL_BASED_OUTPATIENT_CLINIC_OR_DEPARTMENT_OTHER): Payer: Self-pay

## 2024-09-13 ENCOUNTER — Encounter (HOSPITAL_BASED_OUTPATIENT_CLINIC_OR_DEPARTMENT_OTHER): Payer: Self-pay | Admitting: Emergency Medicine

## 2024-09-13 ENCOUNTER — Other Ambulatory Visit: Payer: Self-pay

## 2024-09-13 ENCOUNTER — Emergency Department (HOSPITAL_BASED_OUTPATIENT_CLINIC_OR_DEPARTMENT_OTHER): Payer: Self-pay

## 2024-09-13 ENCOUNTER — Emergency Department (HOSPITAL_BASED_OUTPATIENT_CLINIC_OR_DEPARTMENT_OTHER)
Admission: EM | Admit: 2024-09-13 | Discharge: 2024-09-13 | Disposition: A | Discharge status: Home / Self Care | Payer: Self-pay | Attending: Emergency Medicine | Admitting: Emergency Medicine

## 2024-09-13 DIAGNOSIS — Z7984 Long term (current) use of oral hypoglycemic drugs: Secondary | ICD-10-CM | POA: Diagnosis not present

## 2024-09-13 DIAGNOSIS — K610 Anal abscess: Secondary | ICD-10-CM | POA: Insufficient documentation

## 2024-09-13 DIAGNOSIS — K6289 Other specified diseases of anus and rectum: Secondary | ICD-10-CM | POA: Diagnosis present

## 2024-09-13 DIAGNOSIS — E119 Type 2 diabetes mellitus without complications: Secondary | ICD-10-CM | POA: Diagnosis not present

## 2024-09-13 LAB — COMPREHENSIVE METABOLIC PANEL WITH GFR
ALT: 54 U/L — ABNORMAL HIGH (ref 0–44)
AST: 36 U/L (ref 15–41)
Albumin: 4.1 g/dL (ref 3.5–5.0)
Alkaline Phosphatase: 142 U/L — ABNORMAL HIGH (ref 38–126)
Anion gap: 12 (ref 5–15)
BUN: 8 mg/dL (ref 6–20)
CO2: 24 mmol/L (ref 22–32)
Calcium: 9.4 mg/dL (ref 8.9–10.3)
Chloride: 99 mmol/L (ref 98–111)
Creatinine, Ser: 0.57 mg/dL — ABNORMAL LOW (ref 0.61–1.24)
GFR, Estimated: >60 mL/min
Glucose, Bld: 242 mg/dL — ABNORMAL HIGH (ref 70–99)
Potassium: 4.1 mmol/L (ref 3.5–5.1)
Sodium: 135 mmol/L (ref 135–145)
Total Bilirubin: 0.6 mg/dL (ref 0.0–1.2)
Total Protein: 7.9 g/dL (ref 6.5–8.1)

## 2024-09-13 LAB — CBC WITH DIFFERENTIAL/PLATELET
Abs Immature Granulocytes: 0.02 K/uL (ref 0.00–0.07)
Basophils Absolute: 0 K/uL (ref 0.0–0.1)
Basophils Relative: 0 %
Eosinophils Absolute: 0.2 K/uL (ref 0.0–0.5)
Eosinophils Relative: 3 %
HCT: 43.6 % (ref 39.0–52.0)
Hemoglobin: 15.4 g/dL (ref 13.0–17.0)
Immature Granulocytes: 0 %
Lymphocytes Relative: 22 %
Lymphs Abs: 1.5 K/uL (ref 0.7–4.0)
MCH: 30.7 pg (ref 26.0–34.0)
MCHC: 35.3 g/dL (ref 30.0–36.0)
MCV: 86.9 fL (ref 80.0–100.0)
Monocytes Absolute: 0.5 K/uL (ref 0.1–1.0)
Monocytes Relative: 7 %
Neutro Abs: 4.7 K/uL (ref 1.7–7.7)
Neutrophils Relative %: 68 %
Platelets: 205 K/uL (ref 150–400)
RBC: 5.02 MIL/uL (ref 4.22–5.81)
RDW: 12.2 % (ref 11.5–15.5)
WBC: 7 K/uL (ref 4.0–10.5)
nRBC: 0 % (ref 0.0–0.2)

## 2024-09-13 MED ORDER — LIDOCAINE-EPINEPHRINE (PF) 2 %-1:200000 IJ SOLN
10.0000 mL | INTRAMUSCULAR | Status: DC
  Administered 2024-09-13: 10 mL

## 2024-09-13 MED ORDER — FENTANYL CITRATE (PF) 50 MCG/ML IJ SOSY
50.0000 ug | PREFILLED_SYRINGE | Freq: Once | INTRAMUSCULAR | Status: AC
  Administered 2024-09-13: 50 ug via INTRAVENOUS
  Filled 2024-09-13: qty 1

## 2024-09-13 MED ORDER — FENTANYL CITRATE (PF) 50 MCG/ML IJ SOSY
25.0000 ug | PREFILLED_SYRINGE | Freq: Once | INTRAMUSCULAR | Status: AC
  Administered 2024-09-13: 25 ug via INTRAVENOUS
  Filled 2024-09-13: qty 1

## 2024-09-13 MED ORDER — IOHEXOL 300 MG/ML  SOLN
100.0000 mL | Freq: Once | INTRAMUSCULAR | Status: AC | PRN
  Administered 2024-09-13: 100 mL via INTRAVENOUS

## 2024-09-13 MED ORDER — OXYCODONE HCL 5 MG PO TABS
5.0000 mg | ORAL_TABLET | Freq: Four times a day (QID) | ORAL | 0 refills | Status: AC | PRN
  Filled 2024-09-13: qty 15, 4d supply, fill #0

## 2024-09-13 MED ORDER — LIDOCAINE-EPINEPHRINE (PF) 2 %-1:200000 IJ SOLN
10.0000 mL | Freq: Once | INTRAMUSCULAR | Status: AC
  Administered 2024-09-13: 10 mL
  Filled 2024-09-13: qty 20

## 2024-09-13 MED ORDER — AMOXICILLIN-POT CLAVULANATE 875-125 MG PO TABS
1.0000 | ORAL_TABLET | Freq: Two times a day (BID) | ORAL | 0 refills | Status: AC
  Filled 2024-09-13: qty 14, 7d supply, fill #0

## 2024-09-13 MED ORDER — ONDANSETRON HCL 4 MG/2ML IJ SOLN
4.0000 mg | Freq: Once | INTRAMUSCULAR | Status: AC
  Administered 2024-09-13: 4 mg via INTRAVENOUS
  Filled 2024-09-13: qty 2

## 2024-09-13 MED ORDER — MORPHINE SULFATE (PF) 4 MG/ML IV SOLN
4.0000 mg | Freq: Once | INTRAVENOUS | Status: AC
  Administered 2024-09-13: 4 mg via INTRAVENOUS
  Filled 2024-09-13: qty 1

## 2024-09-13 NOTE — ED Triage Notes (Signed)
 Spanish Interpreter Alm [phone] used Pt c/o rectal pain x 5 days, constipation x 2 days. Denies bleeding.  Endorses difficulty sitting, concern for infection. Took aleve

## 2024-09-13 NOTE — Discharge Instructions (Addendum)
 Take the antibiotic as directed.  Please follow-up with the surgeon that I have listed above.  We were able to drain the abscess but not entirely.  He will need to be reevaluated by surgery.  He also needs to return in 48 hours for a wound check.  If the packing strip falls out before then that is okay.  I have sent some pain medicine into the pharmacy for you.  Do not drive or do anything else dangerous after taking this medication as it will make you drowsy.  Combine this with 1000 mg of Tylenol.  Primarily I recommend that you take Tylenol 1000 mg every 6 hours, ibuprofen 600 mg every 6 hours and use the pain medicine I sent in for severe or breakthrough pain.  Return in 2 days for a wound check.  Tome el antibitico segn las indicaciones. Por favor, haga un seguimiento con el cirujano que mencion anteriormente. Pudimos drenar el absceso, pero no por completo. Deber ser Leroy Jimenez por el cirujano. Tambin necesita regresar en 48 horas para una revisin de la herida. Si la gasa se cae antes, no hay problema. 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 No conduzca ni realice ninguna actividad peligrosa despus de tomar este medicamento, ya que le causar somnolencia. Combnelo con 1000 mg de Tylenol. Principalmente, le recomiendo que tome 1000 mg de Tylenol cada 6 horas, 600 mg de ibuprofeno cada 6 horas y use el analgsico que le envi para el dolor intenso o repentino.

## 2024-09-13 NOTE — ED Provider Notes (Signed)
 " [facility] EMERGENCY DEPARTMENT AT [facility] Provider Note   CSN: [id number] Arrival date & time: 09/13/24  9153     Patient presents with: Rectal Pain   Leroy Jimenez is a 49 y.o. male.   49 year old male presents today for concern of rectal pain that started on Monday with significant worsening in pain since yesterday.  Unable to sit due to pain.  Difficulty having a bowel movement.  Has been trying over-the-counter hemorrhoid cream without any improvement.  No prior history of hemorrhoids.  Denies any fever, drainage.  No rectal bleeding.  The history is provided by the patient. A language interpreter was used.       Prior to Admission medications  Medication Sig Start Date End Date Taking? Authorizing Provider  atorvastatin (LIPITOR) 10 MG tablet Take 10 mg by mouth daily.    [provider]  CARAFATE 1 g tablet Take 1 g by mouth 2 (two) times a day. 01/07/19   [provider]  dapagliflozin propanediol (FARXIGA) 10 MG TABS tablet Take 10 mg by mouth daily.    [provider]  gabapentin (NEURONTIN) 300 MG capsule Take 300 mg by mouth 3 (three) times daily. 3tablets at bedtime    [provider]  metFORMIN (GLUCOPHAGE) 1000 MG tablet 1 tablet 2 (two) times daily. 11/10/18   [provider]  ondansetron  (ZOFRAN ) 4 MG tablet Take 1 tablet (4 mg total) by mouth every 6 (six) hours as needed for nausea or vomiting. 02/06/20   Cirigliano, Vito V, DO  pantoprazole  (PROTONIX ) 20 MG tablet Take 1 tablet (20 mg total) by mouth daily. 02/06/20   Cirigliano, Vito V, DO  Semaglutide (RYBELSUS) 3 MG TABS Take 1 tablet by mouth daily before breakfast.    [provider]  sildenafil (REVATIO) 20 MG tablet 1 tablet as needed. 11/10/18   [provider]    Allergies: Patient has no known allergies.    Review of Systems  Constitutional:  Negative for chills and fever.  Gastrointestinal:  Positive for constipation.  Negative for abdominal pain, blood in stool, nausea and vomiting.  Neurological:  Negative for light-headedness.  All other systems reviewed and are negative.   Updated Vital Signs BP 134/88 (BP Location: Right Arm)   Pulse 91   Temp 98.2 F (36.8 C) (Oral)   Resp 18   Wt 77.1 kg   SpO2 100%   BMI 30.11 kg/m   Physical Exam Vitals and nursing note reviewed.  Constitutional:      General: He is not in acute distress.    Appearance: Normal appearance. He is not ill-appearing.  HENT:     Head: Normocephalic and atraumatic.     Nose: Nose normal.  Eyes:     Conjunctiva/sclera: Conjunctivae normal.  Cardiovascular:     Rate and Rhythm: Normal rate and regular rhythm.  Pulmonary:     Effort: Pulmonary effort is normal. No respiratory distress.  Abdominal:     General: There is no distension.     Palpations: Abdomen is soft.     Tenderness: There is no abdominal tenderness. There is no guarding.  Genitourinary:    Comments: Show paramedic present as chaperone.  Normal rectal tone.  No obvious hemorrhoids present.  There is a small external raised area consistent with an abscess with surrounding induration.  No erythema.  The induration extends into the perineum. Normal rectal tone.  No obvious hemorrhoids. Musculoskeletal:        General: No  deformity. Normal range of motion.  Skin:    Findings: No rash.  Neurological:     Mental Status: He is alert.     (all labs ordered are listed, but only abnormal results are displayed) Labs Reviewed  CBC WITH DIFFERENTIAL/PLATELET  COMPREHENSIVE METABOLIC PANEL WITH GFR    EKG: None  Radiology: No results found.   .Incision and Drainage  Date/Time: 09/13/2024 12:20 PM  Performed by: Hildegard Loge, PA-C Authorized by: Hildegard Loge, PA-C   Consent:    Consent obtained:  Verbal   Consent given by:  Patient   Risks discussed:  Bleeding, incomplete drainage, pain and damage to other organs   Alternatives discussed:  No  treatment Universal protocol:    Procedure explained and questions answered to patient or proxy's satisfaction: yes     Relevant documents present and verified: yes     Test results available : yes     Imaging studies available: yes     Required blood products, implants, devices, and special equipment available: yes     Site/side marked: yes     Immediately prior to procedure, a time out was called: yes     Patient identity confirmed:  Verbally with patient Location:    Type:  Abscess   Size:  4   Location:  Anogenital   Anogenital location:  Perianal Pre-procedure details:    Skin preparation:  Betadine and chlorhexidine  with alcohol Anesthesia:    Anesthesia method:  Local infiltration   Local anesthetic:  10 mL lidocaine -EPINEPHrine  2 %-1:200000 Procedure type:    Complexity:  Complex Procedure details:    Incision types:  Single straight   Incision depth:  Subcutaneous   Wound management:  Probed and deloculated, irrigated with saline and extensive cleaning   Drainage:  Purulent   Drainage amount:  Moderate   Wound treatment:  Wound left open   Packing materials:  1/2 in gauze   Amount 1/2:  5 cm Post-procedure details:    Procedure completion:  Tolerated well, no immediate complications    Medications Ordered in the ED - No data to display  Clinical Course as of 09/13/24 1426  Fri Sep 13, 2024  1130 CT pelvis with evidence of perineal abscess.  Will attempt I&D. [AA]  1214 Incision and drainage performed.  Significant drainage.  However still some induration left.  Was unable to break up additional pockets.  Patient was significantly uncomfortable during the exam.  Able to place packing material partially due to patient's tolerance.  Will provide pain control and discussed with patient plan for antibiotics and follow-up after. [AA]  1424 Patient reevaluation with improvement.  Will discharge.  Discussed returning for wound check.  General surgery referral given.   Stressed importance of this follow-up. [AA]    Clinical Course User Index [AA] Hildegard Loge, PA-C                                 Medical Decision Making Amount and/or Complexity of Data Reviewed Labs: ordered. Radiology: ordered.  Risk Prescription drug management.   Medical Decision Making / ED Course   This patient presents to the ED for concern of rectal pain, this involves an extensive number of treatment options, and is a complaint that carries with it a high risk of complications and morbidity.  The differential diagnosis includes hemorrhoids, abscess, anal fissure, prolapsed  MDM: 49 year old male presents today for concern of rectal  pain.  Exam performed with [facility] paramedic present as chaperone.  Small external area of swelling with fluctuance noted over the left Glute.  Some induration extending into the perineum.  Will obtain blood work, CT pelvis to further evaluate.  Significant output on incision and drainage.  Antibiotics ordered.  Pain control prescribed.  Discussed follow-up with general surgery.  Patient voices understanding and is in agreement with plan.  Discussed wound check in 2 days.  He will return for this.   Lab Tests: -I ordered, reviewed, and interpreted labs.   The pertinent results include:   Labs Reviewed  CBC WITH DIFFERENTIAL/PLATELET  COMPREHENSIVE METABOLIC PANEL WITH GFR      EKG  EKG Interpretation Date/Time:    Ventricular Rate:    PR Interval:    QRS Duration:    QT Interval:    QTC Calculation:   R Axis:      Text Interpretation:           Imaging Studies ordered: I ordered imaging studies including ct pelvis w contrast I independently visualized and interpreted imaging. I agree with the radiologist interpretation   Medicines ordered and prescription drug management: No orders of the defined types were placed in this encounter.   -I have reviewed the patients home medicines and have made adjustments as  needed   Reevaluation: After the interventions noted above, I reevaluated the patient and found that they have :improved  Co morbidities that complicate the patient evaluation  Past Medical History:  Diagnosis Date   Diabetes mellitus without complication (HCC)    GERD (gastroesophageal reflux disease)       Dispostion: Discharged in stable condition.  Return precaution discussed.  Patient voices understanding and is in agreement with plan.  Final diagnoses:  Perianal abscess    ED Discharge Orders          Ordered    oxyCODONE  (ROXICODONE ) 5 MG immediate release tablet  Every 6 hours PRN        09/13/24 1406    amoxicillin -clavulanate (AUGMENTIN ) 875-125 MG tablet  Every 12 hours        09/13/24 1406               Hildegard Loge, PA-C 09/13/24 1426    Rogelia Jerilynn RAMAN, MD 09/20/24 [phone]  "

## 2024-09-13 NOTE — ED Notes (Signed)
 Rectal pain - 2 days - worsening.

## 2024-09-15 ENCOUNTER — Encounter (HOSPITAL_BASED_OUTPATIENT_CLINIC_OR_DEPARTMENT_OTHER): Payer: Self-pay

## 2024-09-15 ENCOUNTER — Emergency Department (HOSPITAL_BASED_OUTPATIENT_CLINIC_OR_DEPARTMENT_OTHER)
Admission: EM | Admit: 2024-09-15 | Discharge: 2024-09-15 | Disposition: A | Discharge status: Home / Self Care | Payer: PRIVATE HEALTH INSURANCE | Attending: Emergency Medicine | Admitting: Emergency Medicine

## 2024-09-15 DIAGNOSIS — Z4800 Encounter for change or removal of nonsurgical wound dressing: Secondary | ICD-10-CM | POA: Insufficient documentation

## 2024-09-15 DIAGNOSIS — Z5189 Encounter for other specified aftercare: Secondary | ICD-10-CM

## 2024-09-15 NOTE — ED Provider Notes (Signed)
 " [facility] AT [facility] Provider Note   CSN: [id number] Arrival date & time: 09/15/24  1224     Patient presents with: No chief complaint on file.   Leroy Jimenez is a 49 y.o. male.   49 year old male returns today for wound check.  He was seen by this provider 2 days ago and had an incision and drainage performed.  No fever since.  Drainage has stopped.  Packing strip fell out yesterday.  He reached out to the surgery office today and states they recommended he wait until the antibiotics are complete to see he needs to schedule an appointment.  The history is provided by the patient. No language interpreter was used.       Prior to Admission medications  Medication Sig Start Date End Date Taking? Authorizing Provider  amoxicillin -clavulanate (AUGMENTIN ) 875-125 MG tablet Take 1 tablet by mouth every 12 (twelve) hours. 09/13/24   Hildegard, Dyan Labarbera, PA-C  atorvastatin (LIPITOR) 10 MG tablet Take 10 mg by mouth daily.    [provider]  CARAFATE 1 g tablet Take 1 g by mouth 2 (two) times a day. 01/07/19   [provider]  dapagliflozin propanediol (FARXIGA) 10 MG TABS tablet Take 10 mg by mouth daily.    [provider]  gabapentin (NEURONTIN) 300 MG capsule Take 300 mg by mouth 3 (three) times daily. 3tablets at bedtime    [provider]  glipiZIDE (GLUCOTROL) 5 MG tablet Take 2.5 mg by mouth 2 (two) times daily. 08/12/24   [provider]  insulin  glargine-yfgn (SEMGLEE) 100 UNIT/ML Pen Inject 20 Units into the skin daily. 07/05/24   [provider]  lisinopril (ZESTRIL) 10 MG tablet Take 10 mg by mouth daily. 08/07/24   [provider]  metFORMIN (GLUCOPHAGE) 1000 MG tablet 1 tablet 2 (two) times daily. 11/10/18   [provider]  nortriptyline (PAMELOR) 25 MG capsule Take 25 mg by mouth at bedtime.    [provider]  ondansetron  (ZOFRAN ) 4 MG tablet Take 1 tablet (4 mg  total) by mouth every 6 (six) hours as needed for nausea or vomiting. 02/06/20   Cirigliano, Vito V, DO  oxyCODONE  (ROXICODONE ) 5 MG immediate release tablet Take 1 tablet (5 mg total) by mouth every 6 (six) hours as needed for severe pain (pain score 7-10). 09/13/24   Hildegard Loge, PA-C  pantoprazole  (PROTONIX ) 20 MG tablet Take 1 tablet (20 mg total) by mouth daily. 02/06/20   Cirigliano, Vito V, DO  Semaglutide (RYBELSUS) 3 MG TABS Take 1 tablet by mouth daily before breakfast.    [provider]  sildenafil (REVATIO) 20 MG tablet 1 tablet as needed. 11/10/18   [provider]    Allergies: Patient has no known allergies.    Review of Systems  Constitutional:  Negative for fever.  Skin:  Positive for wound.  All other systems reviewed and are negative.   Updated Vital Signs BP (!) 142/85 (BP Location: Right Arm)   Pulse 93   Temp 98.6 F (37 C)   Resp 17   SpO2 99%   Physical Exam Vitals and nursing note reviewed.  Constitutional:      General: He is not in acute distress.    Appearance: Normal appearance. He is not ill-appearing.  HENT:     Head: Normocephalic and atraumatic.     Nose: Nose normal.  Eyes:     Conjunctiva/sclera: Conjunctivae normal.  Pulmonary:     Effort:  Pulmonary effort is normal. No respiratory distress.  Genitourinary:    Comments: Perianal abscess opening close.  No surrounding erythema.  Swelling has gone down.  Still some induration and tenderness present.  No evidence of subcutaneous emphysema.  No drainage. Musculoskeletal:        General: No deformity. Normal range of motion.     Cervical back: Normal range of motion.  Skin:    Findings: No rash.  Neurological:     Mental Status: He is alert.     (all labs ordered are listed, but only abnormal results are displayed) Labs Reviewed - No data to display  EKG: None  Radiology: No results found.   Procedures   Medications Ordered in the ED - No data to display                                   Medical Decision Making  49 year old male presents today for concern of wound recheck.  He had an I&D 2 days ago this was performed by this provider.  Wound appears to be healing well.  This was a fairly large pocket and not all of the pocket was drained.  However no acute signs of infection.  He will continue taking the Augmentin  antibiotic that was prescribed to him.  Advised him to call the general surgery office tomorrow to schedule an appointment for reevaluation and further evaluation by them as entire pocket was not drained he still has significant induration and tenderness.   Final diagnoses:  Wound check, abscess    ED Discharge Orders     None          Hildegard Loge, [place] 09/15/24 1329  "

## 2024-09-15 NOTE — Discharge Instructions (Signed)
 Your wound looks well today.  No worsening signs of infection.  He still have some induration.  We discussed how large your infection pocket was and how we were not able to fully drain all of it in the emergency department.  Please ensure that you follow-up with the surgery office.  Continue taking the antibiotics.

## 2024-09-15 NOTE — ED Triage Notes (Signed)
 He states he is here for recheck of recent peri rectal I & D. He denies any fever, nor any other problem. He is ambulatory and in no distress.
